# Patient Record
Sex: Male | Born: 1960 | Race: White | Hispanic: No | Marital: Married | Smoking: Former smoker
Health system: Southern US, Community
[De-identification: ages and names within clinical notes are randomized; demographics above are authoritative.]

## PROBLEM LIST (undated history)

## (undated) DIAGNOSIS — I219 Acute myocardial infarction, unspecified: Secondary | ICD-10-CM

## (undated) DIAGNOSIS — D499 Neoplasm of unspecified behavior of unspecified site: Secondary | ICD-10-CM

## (undated) DIAGNOSIS — R12 Heartburn: Secondary | ICD-10-CM

## (undated) DIAGNOSIS — Q676 Pectus excavatum: Secondary | ICD-10-CM

## (undated) DIAGNOSIS — N529 Male erectile dysfunction, unspecified: Secondary | ICD-10-CM

## (undated) DIAGNOSIS — I10 Essential (primary) hypertension: Secondary | ICD-10-CM

## (undated) DIAGNOSIS — Z86018 Personal history of other benign neoplasm: Secondary | ICD-10-CM

## (undated) HISTORY — PX: INGUINAL HERNIA REPAIR: SUR1180

## (undated) HISTORY — DX: Pectus excavatum: Q67.6

## (undated) HISTORY — DX: Male erectile dysfunction, unspecified: N52.9

## (undated) HISTORY — PX: CORONARY ANGIOPLASTY: SHX604

## (undated) HISTORY — PX: HERNIA REPAIR: SHX51

## (undated) HISTORY — DX: Neoplasm of unspecified behavior of unspecified site: D49.9

## (undated) HISTORY — DX: Heartburn: R12

## (undated) HISTORY — DX: Personal history of other benign neoplasm: Z86.018

---

## 2003-06-14 DIAGNOSIS — Z86018 Personal history of other benign neoplasm: Secondary | ICD-10-CM

## 2003-06-14 HISTORY — DX: Personal history of other benign neoplasm: Z86.018

## 2011-04-22 ENCOUNTER — Encounter: Payer: Self-pay | Admitting: Cardiovascular Disease

## 2011-05-26 ENCOUNTER — Encounter: Payer: Self-pay | Admitting: Cardiovascular Disease

## 2011-05-26 ENCOUNTER — Ambulatory Visit (INDEPENDENT_AMBULATORY_CARE_PROVIDER_SITE_OTHER): Payer: 59 | Admitting: Cardiovascular Disease

## 2011-05-26 ENCOUNTER — Encounter: Payer: Self-pay | Admitting: *Deleted

## 2011-05-26 DIAGNOSIS — R079 Chest pain, unspecified: Secondary | ICD-10-CM | POA: Insufficient documentation

## 2011-05-26 DIAGNOSIS — Q676 Pectus excavatum: Secondary | ICD-10-CM | POA: Insufficient documentation

## 2011-05-26 DIAGNOSIS — R9431 Abnormal electrocardiogram [ECG] [EKG]: Secondary | ICD-10-CM | POA: Insufficient documentation

## 2011-05-26 DIAGNOSIS — Z87891 Personal history of nicotine dependence: Secondary | ICD-10-CM | POA: Insufficient documentation

## 2011-05-26 DIAGNOSIS — R0602 Shortness of breath: Secondary | ICD-10-CM | POA: Insufficient documentation

## 2011-05-26 MED ORDER — ATORVASTATIN CALCIUM 10 MG PO TABS: 10.0000 mg | ORAL_TABLET | Freq: Every day | ORAL | Status: DC

## 2011-05-26 NOTE — Assessment & Plan Note (Signed)
He is relatively asymptomatic from his pectus excavatum. No significant shortness of breath with exertion. We'll order a treadmill study given his risk factor for coronary artery disease. EKG changes likely secondary to the positioning of his heart given the pectus. As he is relatively asymptomatic, no radical surgery needed.

## 2011-05-26 NOTE — Assessment & Plan Note (Signed)
Poor R-wave progression through the anterior precordial leads. Suspect secondary to his pectus.

## 2011-05-26 NOTE — Progress Notes (Signed)
   Patient ID: Daniel Benson, male    DOB: May 14, 1960, 51 y.o.   MRN: [id number]  HPI Comments: Daniel Benson is a very pleasant 51 year old gentleman with pectus excavatum who works at [employer] with 30 year smoking history, stopped 6 years ago with chronic shortness of breath, improved since he stopped smoking, rare episodes of left-sided chest pain and jaw pain typically at rest who presents for evaluation.  He reports that he is typically very active on weekends. He does hunting, sometimes climbs hills, chops wood. He does have shortness of breath but this is stable and probably better than 6 years ago. He has rare episodes of fleeting left-sided flank/chest pain lasting several seconds at a time, typically at rest. He describes it as "little stuff ". No new changes over the past several months in terms of his exercise ability. Jaw pain happens at times and feels very tight on the left.   EKG shows normal sinus rhythm with rate 75 beats per minute with poor R-wave progression through the anterior precordial leads, likely secondary to pectus excavatum.   Outpatient Encounter Prescriptions as of 05/26/2011  Medication Sig Dispense Refill  . Aspirin-Salicylamide-Caffeine (BC HEADACHE POWDER PO) Take by mouth.        Review of Systems  Constitutional: Negative.   HENT: Negative.   Eyes: Negative.   Respiratory: Negative.   Cardiovascular: Negative.   Gastrointestinal: Negative.   Musculoskeletal: Negative.   Skin: Negative.   Neurological: Negative.   Hematological: Negative.   Psychiatric/Behavioral: Negative.   All other systems reviewed and are negative.    BP 145/88  Pulse 75  Ht 5\' 11"  (1.803 m)  Wt 216 lb (97.977 kg)  BMI 30.13 kg/m2  Physical Exam  Nursing note and vitals reviewed. Constitutional: He is oriented to person, place, and time. He appears well-developed and well-nourished.  HENT:  Head: Normocephalic.  Nose: Nose normal.  Mouth/Throat: Oropharynx is clear  and moist.  Eyes: Conjunctivae are normal. Pupils are equal, round, and reactive to light.  Neck: Normal range of motion. Neck supple. No JVD present.  Cardiovascular: Normal rate, regular rhythm, S1 normal, S2 normal, normal heart sounds and intact distal pulses.  Exam reveals no gallop and no friction rub.   No murmur heard. Pulmonary/Chest: Effort normal and breath sounds normal. No respiratory distress. He has no wheezes. He has no rales. He exhibits no tenderness.       Sternum has significant depression inward it is nontender  Abdominal: Soft. Bowel sounds are normal. He exhibits no distension. There is no tenderness.  Musculoskeletal: Normal range of motion. He exhibits no edema and no tenderness.  Lymphadenopathy:    He has no cervical adenopathy.  Neurological: He is alert and oriented to person, place, and time. Coordination normal.  Skin: Skin is warm and dry. No rash noted. No erythema.  Psychiatric: He has a normal mood and affect. His behavior is normal. Judgment and thought content normal.           Assessment and Plan

## 2011-05-26 NOTE — Assessment & Plan Note (Signed)
Long history of smoking. I suspect he may have mild COPD

## 2011-05-26 NOTE — Patient Instructions (Signed)
You are doing well. Please start aspirin 81 mg coated once a day Start lipitor 10 mg once a day  We will set you up for a treadmill test for shortness of breath  Please call us if you have new issues that need to be addressed before your next appt.  Your physician wants you to follow-up in: 12 months.  You will receive a reminder letter in the mail two months in advance. If you don't receive a letter, please call our office to schedule the follow-up appointment.

## 2011-05-26 NOTE — Assessment & Plan Note (Signed)
Atypical type chest pain. We have suggested he have a routine treadmill study as he is concerned about his symptoms.

## 2011-05-26 NOTE — Assessment & Plan Note (Signed)
Treadmill study ordered for shortness of breath. Suspect secondary to long smoking history.

## 2011-06-11 ENCOUNTER — Ambulatory Visit (INDEPENDENT_AMBULATORY_CARE_PROVIDER_SITE_OTHER): Payer: 59 | Admitting: Cardiovascular Disease

## 2011-06-11 DIAGNOSIS — R079 Chest pain, unspecified: Secondary | ICD-10-CM

## 2011-06-11 DIAGNOSIS — R0602 Shortness of breath: Secondary | ICD-10-CM

## 2011-06-11 NOTE — Progress Notes (Signed)
Exercise Treadmill Test   Treadmill ordered for recent epsiodes of chest pain.  Resting EKG shows NSR with rate of 71 bpm, poor R wave progression Resting blood pressure of 122/80. Stand bruce protocal was used.  Patient exercised for 9 min 00 sec,  Peak heart rate of 160 bpm.  This was 100% of the maximum predicted heart rate (target heart rate 142). No symptoms of chest pain or lightheadedness were reported at peak stress or in recovery.  Peak Blood pressure recorded was 160/90. Heart rate at 3 minutes in recovery was 96 bpm. No ST changes concerning for ischemia.  FINAL IMPRESSION: Normal exercise stress test. No significant EKG changes concerning for ischemia. Excellent exercise tolerance.

## 2011-06-11 NOTE — Patient Instructions (Signed)
Normal stress test. Continue regular exercise and call the office for worsening symptoms, please follow up on an annual basis

## 2011-07-18 ENCOUNTER — Ambulatory Visit: Payer: Self-pay | Admitting: Gastroenterology

## 2011-07-18 LAB — HM COLONOSCOPY

## 2012-06-25 ENCOUNTER — Ambulatory Visit: Payer: 59 | Admitting: Cardiovascular Disease

## 2012-07-12 ENCOUNTER — Encounter: Payer: Self-pay | Admitting: Cardiovascular Disease

## 2012-07-12 ENCOUNTER — Ambulatory Visit (INDEPENDENT_AMBULATORY_CARE_PROVIDER_SITE_OTHER): Payer: 59 | Admitting: Cardiovascular Disease

## 2012-07-12 VITALS — BP 124/84 | HR 53 | Ht 71.0 in | Wt 209.0 lb

## 2012-07-12 DIAGNOSIS — R9431 Abnormal electrocardiogram [ECG] [EKG]: Secondary | ICD-10-CM

## 2012-07-12 DIAGNOSIS — E785 Hyperlipidemia, unspecified: Secondary | ICD-10-CM | POA: Insufficient documentation

## 2012-07-12 DIAGNOSIS — R0602 Shortness of breath: Secondary | ICD-10-CM

## 2012-07-12 NOTE — Patient Instructions (Addendum)
You are doing well. No medication changes were made.  We will check lipids today  Please call us if you have new issues that need to be addressed before your next appt.  Your physician wants you to follow-up in: 12 month.  You will receive a reminder letter in the mail two months in advance. If you don't receive a letter, please call our office to schedule the follow-up appointment.

## 2012-07-12 NOTE — Assessment & Plan Note (Signed)
We have suggested he continue on his statin. Continued exercise and small weight loss

## 2012-07-12 NOTE — Assessment & Plan Note (Signed)
Shortness of breath improved after he stopped smoking 7 years ago. No further episodes of chest pain. No further workup at this time

## 2012-07-12 NOTE — Progress Notes (Signed)
   Patient ID: Daniel Benson, male    DOB: 02-05-1961, 52 y.o.   MRN: [id number]  HPI Comments: Mr. Gerdeman is a very pleasant 52 year old gentleman with pectus excavatum who works at [employer] with 30 year smoking history, stopped in 2008 , previously with shortness of breath, improved since he stopped smoking, also with previous rare episodes of left-sided chest pain and jaw pain typically at rest who presents for routine followup  He reports that he is typically very active on weekends. He does hunting, sometimes climbs hills, chops wood. Shortness of breath has significantly improved. No recent chest pain symptoms. No recent jaw pain symptoms.   Total cholesterol 193 on low-dose statin   EKG shows normal sinus rhythm with rate 53 beats per minute with poor R-wave progression through the anterior precordial leads, likely secondary to pectus excavatum.   Outpatient Encounter Prescriptions as of 07/12/2012  Medication Sig Dispense Refill  . aspirin 81 MG tablet Take 81 mg by mouth daily.      . Aspirin-Salicylamide-Caffeine (BC HEADACHE POWDER PO) Take by mouth.      Marland Kitchen atorvastatin (LIPITOR) 10 MG tablet Take 1 tablet (10 mg total) by mouth daily.  90 tablet  4  . BIOTIN PO Take 1 tablet by mouth daily.       No facility-administered encounter medications on file as of 07/12/2012.     Review of Systems  Constitutional: Negative.   HENT: Negative.   Eyes: Negative.   Respiratory: Negative.   Cardiovascular: Negative.   Gastrointestinal: Negative.   Musculoskeletal: Negative.   Skin: Negative.   Neurological: Negative.   Psychiatric/Behavioral: Negative.   All other systems reviewed and are negative.    BP 124/84  Pulse 53  Ht 5\' 11"  (1.803 m)  Wt 209 lb (94.802 kg)  BMI 29.16 kg/m2  Physical Exam  Nursing note and vitals reviewed. Constitutional: He is oriented to person, place, and time. He appears well-developed and well-nourished.  HENT:  Head: Normocephalic.  Nose:  Nose normal.  Mouth/Throat: Oropharynx is clear and moist.  Eyes: Conjunctivae are normal. Pupils are equal, round, and reactive to light.  Neck: Normal range of motion. Neck supple. No JVD present.  Cardiovascular: Normal rate, regular rhythm, S1 normal, S2 normal, normal heart sounds and intact distal pulses.  Exam reveals no gallop and no friction rub.   No murmur heard. Pulmonary/Chest: Effort normal and breath sounds normal. No respiratory distress. He has no wheezes. He has no rales. He exhibits no tenderness.  Sternum has significant depression inward it is nontender  Abdominal: Soft. Bowel sounds are normal. He exhibits no distension. There is no tenderness.  Musculoskeletal: Normal range of motion. He exhibits no edema and no tenderness.  Lymphadenopathy:    He has no cervical adenopathy.  Neurological: He is alert and oriented to person, place, and time. Coordination normal.  Skin: Skin is warm and dry. No rash noted. No erythema.  Psychiatric: He has a normal mood and affect. His behavior is normal. Judgment and thought content normal.      Assessment and Plan

## 2012-07-13 LAB — LIPID PANEL
Chol/HDL Ratio: 2.3 ratio units (ref 0.0–5.0)
Cholesterol, Total: 142 mg/dL (ref 100–199)
HDL: 61 mg/dL (ref >39)
Triglycerides: 64 mg/dL (ref 0–149)

## 2012-08-24 DIAGNOSIS — Z86018 Personal history of other benign neoplasm: Secondary | ICD-10-CM

## 2012-08-24 HISTORY — DX: Personal history of other benign neoplasm: Z86.018

## 2012-08-30 ENCOUNTER — Other Ambulatory Visit: Payer: Self-pay | Admitting: Cardiovascular Disease

## 2012-08-31 ENCOUNTER — Other Ambulatory Visit: Payer: Self-pay | Admitting: *Deleted

## 2012-08-31 MED ORDER — ATORVASTATIN CALCIUM 10 MG PO TABS: 10.0000 mg | ORAL_TABLET | Freq: Every day | ORAL | Status: DC

## 2012-08-31 NOTE — Telephone Encounter (Signed)
Refilled [facility]. 

## 2013-03-09 ENCOUNTER — Ambulatory Visit: Payer: Self-pay | Admitting: Family Medicine

## 2013-07-01 ENCOUNTER — Ambulatory Visit: Payer: Self-pay | Admitting: Family Medicine

## 2013-08-17 ENCOUNTER — Telehealth: Payer: Self-pay | Admitting: *Deleted

## 2013-08-17 NOTE — Telephone Encounter (Signed)
Called patient for a 12 mth follow up appt with Dr. Rockey Situ. Patient stated that he didn't need to see Dr. Rockey Situ and didn't want to schedule one.

## 2014-12-14 ENCOUNTER — Other Ambulatory Visit: Payer: Self-pay | Admitting: Family Medicine

## 2015-03-10 ENCOUNTER — Other Ambulatory Visit: Payer: Self-pay | Admitting: Family Medicine

## 2015-06-09 ENCOUNTER — Other Ambulatory Visit: Payer: Self-pay | Admitting: Family Medicine

## 2015-09-15 ENCOUNTER — Other Ambulatory Visit: Payer: Self-pay | Admitting: Family Medicine

## 2015-10-17 ENCOUNTER — Ambulatory Visit (INDEPENDENT_AMBULATORY_CARE_PROVIDER_SITE_OTHER): Payer: 59

## 2015-10-17 ENCOUNTER — Encounter: Payer: Self-pay | Admitting: Podiatry

## 2015-10-17 ENCOUNTER — Ambulatory Visit (INDEPENDENT_AMBULATORY_CARE_PROVIDER_SITE_OTHER): Payer: 59 | Admitting: Podiatry

## 2015-10-17 VITALS — BP 135/82 | HR 66 | Resp 12

## 2015-10-17 DIAGNOSIS — B9689 Other specified bacterial agents as the cause of diseases classified elsewhere: Secondary | ICD-10-CM | POA: Insufficient documentation

## 2015-10-17 DIAGNOSIS — M722 Plantar fascial fibromatosis: Secondary | ICD-10-CM | POA: Diagnosis not present

## 2015-10-17 DIAGNOSIS — M79673 Pain in unspecified foot: Secondary | ICD-10-CM

## 2015-10-17 DIAGNOSIS — N529 Male erectile dysfunction, unspecified: Secondary | ICD-10-CM | POA: Insufficient documentation

## 2015-10-17 DIAGNOSIS — J019 Acute sinusitis, unspecified: Secondary | ICD-10-CM

## 2015-10-17 MED ORDER — METHYLPREDNISOLONE 4 MG PO TBPK: ORAL_TABLET | ORAL | 0 refills | Status: DC

## 2015-10-17 MED ORDER — MELOXICAM 15 MG PO TABS: 15.0000 mg | ORAL_TABLET | Freq: Every day | ORAL | 3 refills | Status: DC

## 2015-10-17 NOTE — Progress Notes (Signed)
   Subjective:    Patient ID: Daniel Benson, male    DOB: 1960-04-08, 55 y.o.   MRN: [id number]  HPI: He presents today with chief complaint of painful heels bilateral right greater than left times past 5 or 6 months. Mornings are particularly bad in that region sitting and gets back up walking is very painful. He works for [employer].    Review of Systems  Musculoskeletal: Positive for gait problem.       Objective:   Physical Exam: Vital signs are stable he is alert and oriented 3 pulses are palpable. Neurologic sensorium is intact. Muscle strength is normal. Orthopedic evaluation of systems a full range of motion or crepitation. He has pain on palpation medial calcaneal tubercle of the bilateral foot) the left. Radiographs taken today demonstrate osseously mature individual with soft tissue increased density of the plantar fascia calcaneal insertion site bilateral.          Assessment & Plan:  Assessment: Plantar fasciitis right greater than left.  Plan: Started him on a Medrol Dosepak to be followed by meloxicam. Injected the bilateral heels today placing the plantar fascia braces and a night splint. Discussed appropriate shoe gear stretching size ice therapies U modifications follow up with him in 1 month.

## 2015-10-17 NOTE — Progress Notes (Signed)
   Subjective:    Patient ID: Daniel Benson, male    DOB: 03-Feb-1961, 55 y.o.   MRN: [id number]  HPI    Review of Systems  All other systems reviewed and are negative.      Objective:   Physical Exam        Assessment & Plan:

## 2015-10-24 ENCOUNTER — Ambulatory Visit: Payer: Self-pay | Admitting: Podiatry

## 2015-11-02 ENCOUNTER — Ambulatory Visit (INDEPENDENT_AMBULATORY_CARE_PROVIDER_SITE_OTHER): Payer: 59 | Admitting: Family Medicine

## 2015-11-02 ENCOUNTER — Encounter: Payer: Self-pay | Admitting: Family Medicine

## 2015-11-02 VITALS — BP 124/64 | HR 66 | Temp 98.1°F | Resp 16 | Wt 208.8 lb

## 2015-11-02 DIAGNOSIS — H0013 Chalazion right eye, unspecified eyelid: Secondary | ICD-10-CM

## 2015-11-02 MED ORDER — ERYTHROMYCIN 5 MG/GM OP OINT: 1.0000 "application " | TOPICAL_OINTMENT | Freq: Four times a day (QID) | OPHTHALMIC | 0 refills | Status: DC

## 2015-11-02 NOTE — Patient Instructions (Signed)
Call if not improving for referral if needed to ophthalmology.

## 2015-11-02 NOTE — Progress Notes (Signed)
Subjective:     Patient ID: Daniel Benson, male   DOB: September 24, 1960, 55 y.o.   MRN: [id number]  HPI  Chief Complaint  Patient presents with  . Conjunctivitis    Patient comes in office today with concerns of itchy watery eyes over the past two weeks. Patient denies any cold like symptoms or URI symptoms. Patient reports that "it feels like something is in my eye."  . Jaw Pain    Patient reports "popping" of jaw for more than 30 days.   States right upper eyelid intermittently bothers him. No change in vision, matting, or contact use. Jaw popping is painless. Patiens reports jaw clenching at times but unsure whether he grinds his teeth. Currently on prednisone for an orthopedic problem.   Review of Systems     Objective:   Physical Exam  Constitutional: He appears well-developed and well-nourished. No distress.  Eyes: Pupils are equal, round, and reactive to light.  Right upper eyelid with 2-3 mm. Area of swelling, no drainage.  Musculoskeletal:  TMJ's without tenderness or crepitus with movement       Assessment:    1. Chalazion of right eye - erythromycin ophthalmic ointment; Place 1 application into the right eye 4 (four) times daily. Place in lower eyelid sac  Dispense: 3.5 g; Refill: 0    Plan:    Will call for ophthalmology referral if not improving. Will get dental opinion about his jaw.

## 2015-11-28 ENCOUNTER — Encounter: Payer: Self-pay | Admitting: Podiatry

## 2015-11-28 ENCOUNTER — Ambulatory Visit (INDEPENDENT_AMBULATORY_CARE_PROVIDER_SITE_OTHER): Payer: 59 | Admitting: Podiatry

## 2015-11-28 DIAGNOSIS — M779 Enthesopathy, unspecified: Secondary | ICD-10-CM

## 2015-11-28 DIAGNOSIS — M778 Other enthesopathies, not elsewhere classified: Secondary | ICD-10-CM

## 2015-11-28 DIAGNOSIS — M722 Plantar fascial fibromatosis: Secondary | ICD-10-CM | POA: Diagnosis not present

## 2015-11-28 DIAGNOSIS — M7751 Other enthesopathy of right foot: Secondary | ICD-10-CM

## 2015-11-29 NOTE — Progress Notes (Signed)
He presents today for follow-up of his plantar fasciitis bilaterally. He states that the plantarflexed fasciitis is considerably better however he still has tenderness around the forefoot as he points to the second third metatarsophalangeal joint area of the right foot. He denies changes in his past medical history medications and allergies states that he's been taking his meloxicam without any complications. Continues to utilize his plantar fascia brace and night splint.  Objective: Vital signs are stable alert and oriented 3. Pulses are palpable. Neurologic sensorium is intact much decrease of pain on palpation medial calcaneal tubercles bilateral. He still has tenderness on palpation of the second and third metatarsophalangeal joints of the right foot. No deviation of the toes.  Assessment: Capsulitis of the forefoot right resolving plantar fasciitis bilaterally.  Plan: I reinjected the forefoot today with Kenalog and local anesthetic. No injections to the heels. I recommended that he continue his anti-inflammatories plantar fascia brace and night splint. I'll follow-up with him in 1 month at which time we will consider orthotics if necessary.

## 2015-12-14 ENCOUNTER — Encounter: Payer: Self-pay | Admitting: Family Medicine

## 2015-12-14 ENCOUNTER — Ambulatory Visit (INDEPENDENT_AMBULATORY_CARE_PROVIDER_SITE_OTHER): Payer: 59 | Admitting: Family Medicine

## 2015-12-14 VITALS — BP 124/72 | HR 66 | Temp 98.2°F | Resp 14 | Ht 70.75 in | Wt 204.0 lb

## 2015-12-14 DIAGNOSIS — Z23 Encounter for immunization: Secondary | ICD-10-CM | POA: Diagnosis not present

## 2015-12-14 DIAGNOSIS — Z0000 Encounter for general adult medical examination without abnormal findings: Secondary | ICD-10-CM | POA: Diagnosis not present

## 2015-12-14 DIAGNOSIS — E782 Mixed hyperlipidemia: Secondary | ICD-10-CM | POA: Diagnosis not present

## 2015-12-14 NOTE — Progress Notes (Signed)
Subjective:     Patient ID: Daniel Benson, male   DOB: Oct 03, 1960, 55 y.o.   MRN: [id number]  HPI  Chief Complaint  Patient presents with  . Annual Exam  States he feel well. Will be due to repeat colonoscopy in 2018.   Review of Systems General: Feeling well; will update Tdap and influenza vaccines HEENT: regular dental visits; overdue for eye exam and encouraged to do so Cardiovascular: no chest pain, shortness of breath, or palpitations GI: no heartburn, no change in bowel habits or blood in the stool GU: nocturia x 1, no change in bladder habits  Psychiatric: not depressed Musculoskeletal: reports right knee tend to have painless grinding when going up and down stairs. Currently seeing podiatry for plantar fasciitis. Skin: See dermatology, Dr.Kowalski, annually for annual skin survey.    Objective:   Physical Exam  Constitutional: He appears well-developed and well-nourished. No distress.  Eyes: PERRLA, EOMI Neck: no thyromegaly, tenderness or nodules, no carotid bruits or cervical adenopathy ENT: TM's intact without inflammation; No tonsillar enlargement or exudate, Lungs: Clear, pectus excavatum Heart : RRR without murmur or gallop Abd: bowel sounds present, soft, non-tender, no organomegaly Rectal: Prostate firm and non-tender Extremities: no edema, Right knee with FROM without crepitus. Skin: no atypical lesions noted; seborrheic keratoses on his back    Assessment:    1. Annual physical exam - PSA - Comprehensive metabolic panel  2. Need for influenza vaccination - Flu Vaccine QUAD 36+ mos PF IM (Fluarix & Fluzone Quad PF)  3. Need for diphtheria-tetanus-pertussis (Tdap) vaccine - Tdap vaccine greater than or equal to 7yo IM  4. Mixed hyperlipidemia - Lipid panel   Plan:    Further f/u pending lab work.

## 2015-12-14 NOTE — Patient Instructions (Signed)
We will cal you with the lab results. 

## 2015-12-15 LAB — COMPREHENSIVE METABOLIC PANEL
A/G RATIO: 1.8 (ref 1.2–2.2)
ALBUMIN: 4.2 g/dL (ref 3.5–5.5)
ALT: 24 IU/L (ref 0–44)
AST: 20 IU/L (ref 0–40)
Alkaline Phosphatase: 67 IU/L (ref 39–117)
BUN / CREAT RATIO: 15 (ref 9–20)
BUN: 16 mg/dL (ref 6–24)
Bilirubin Total: 0.8 mg/dL (ref 0.0–1.2)
CALCIUM: 9.5 mg/dL (ref 8.7–10.2)
CO2: 27 mmol/L (ref 18–29)
Chloride: 101 mmol/L (ref 96–106)
Creatinine, Ser: 1.07 mg/dL (ref 0.76–1.27)
GFR, EST AFRICAN AMERICAN: 89 mL/min/{1.73_m2} (ref >59)
GFR, EST NON AFRICAN AMERICAN: 77 mL/min/{1.73_m2} (ref >59)
GLOBULIN, TOTAL: 2.4 g/dL (ref 1.5–4.5)
Glucose: 95 mg/dL (ref 65–99)
POTASSIUM: 4.7 mmol/L (ref 3.5–5.2)
Sodium: 143 mmol/L (ref 134–144)
TOTAL PROTEIN: 6.6 g/dL (ref 6.0–8.5)

## 2015-12-15 LAB — LIPID PANEL
CHOL/HDL RATIO: 2.5 ratio (ref 0.0–5.0)
CHOLESTEROL TOTAL: 132 mg/dL (ref 100–199)
HDL: 53 mg/dL (ref >39)
LDL Calculated: 65 mg/dL (ref 0–99)
TRIGLYCERIDES: 68 mg/dL (ref 0–149)
VLDL Cholesterol Cal: 14 mg/dL (ref 5–40)

## 2015-12-15 LAB — PSA: PROSTATE SPECIFIC AG, SERUM: 1.7 ng/mL (ref 0.0–4.0)

## 2015-12-18 ENCOUNTER — Telehealth: Payer: Self-pay

## 2015-12-18 NOTE — Telephone Encounter (Signed)
-----   Message from Carmon Ginsberg, [place] sent at 12/15/2015  9:02 AM EDT ----- Labs look good-continue current medication

## 2015-12-18 NOTE — Telephone Encounter (Signed)
[id number] 

## 2015-12-19 NOTE — Telephone Encounter (Signed)
Pt is returning call.  CB#[phone]/MW

## 2015-12-19 NOTE — Telephone Encounter (Signed)
Patient is requesting a refill for Atorvastatin sent to [facility] [address]

## 2015-12-19 NOTE — Telephone Encounter (Signed)
Patient has been advised. KW 

## 2015-12-20 ENCOUNTER — Other Ambulatory Visit: Payer: Self-pay | Admitting: Family Medicine

## 2015-12-20 DIAGNOSIS — E782 Mixed hyperlipidemia: Secondary | ICD-10-CM

## 2015-12-20 MED ORDER — ATORVASTATIN CALCIUM 10 MG PO TABS: 10.0000 mg | ORAL_TABLET | Freq: Every day | ORAL | 3 refills | Status: DC

## 2015-12-20 NOTE — Telephone Encounter (Signed)
Refill completed.

## 2015-12-21 ENCOUNTER — Telehealth: Payer: Self-pay | Admitting: *Deleted

## 2015-12-21 MED ORDER — MELOXICAM 15 MG PO TABS: 15.0000 mg | ORAL_TABLET | Freq: Every day | ORAL | 0 refills | Status: DC

## 2015-12-21 NOTE — Telephone Encounter (Signed)
Received request for #90 Meloxicam 15mg . Dr. Marta Antu for #90 +1refill.

## 2015-12-31 ENCOUNTER — Encounter: Payer: Self-pay | Admitting: Podiatry

## 2015-12-31 ENCOUNTER — Ambulatory Visit (INDEPENDENT_AMBULATORY_CARE_PROVIDER_SITE_OTHER): Payer: 59 | Admitting: Podiatry

## 2015-12-31 DIAGNOSIS — M722 Plantar fascial fibromatosis: Secondary | ICD-10-CM | POA: Diagnosis not present

## 2015-12-31 DIAGNOSIS — M779 Enthesopathy, unspecified: Secondary | ICD-10-CM

## 2015-12-31 DIAGNOSIS — M7751 Other enthesopathy of right foot: Secondary | ICD-10-CM

## 2015-12-31 DIAGNOSIS — M778 Other enthesopathies, not elsewhere classified: Secondary | ICD-10-CM

## 2015-12-31 NOTE — Progress Notes (Signed)
He presents today for follow-up of plantar fasciitis and forefoot pain right. He states that the bilateral plantar fasciitis is resolving quite nicely at approximately 98% improved. He is still complaining of some soreness in the forefoot which she states seems to be getting better after the injection.  Objective: Vital signs are stable he is alert and oriented 3. No reproducible pain on palpation today.  Assessment: Resolving plantar fasciitis and started forefoot capsulitis.  Plan: Follow up with me on an as-needed basis and continue conservative therapies including anti-inflammatories and braces.

## 2016-01-31 ENCOUNTER — Ambulatory Visit (INDEPENDENT_AMBULATORY_CARE_PROVIDER_SITE_OTHER): Payer: 59 | Admitting: Family Medicine

## 2016-01-31 ENCOUNTER — Encounter: Payer: Self-pay | Admitting: Family Medicine

## 2016-01-31 VITALS — BP 110/70 | HR 58 | Temp 98.3°F | Resp 16 | Wt 211.6 lb

## 2016-01-31 DIAGNOSIS — B001 Herpesviral vesicular dermatitis: Secondary | ICD-10-CM | POA: Diagnosis not present

## 2016-01-31 MED ORDER — VALACYCLOVIR HCL 1 G PO TABS: ORAL_TABLET | ORAL | 1 refills | Status: AC

## 2016-01-31 NOTE — Patient Instructions (Signed)
Cold Sore Introduction A cold sore, also called a fever blister, is a skin infection that is caused by a virus. This infection causes small, fluid-filled sores to form inside of the mouth or on the lips, gums, nose, chin, or cheeks. Cold sores can spread to other parts of the body, such as the eyes or fingers. Cold sores can be spread or passed from person to person (contagious) until the sores crust over completely. Cold sores can be spread through close contact, such as kissing or sharing a drinking glass. Follow these instructions at home: Medicines  Take or apply over-the-counter and prescription medicines only as told by your doctor.  Use a cotton-tip swab to apply creams or gels to your sores. Sore Care  Do not touch the sores or pick the scabs.  Wash your hands often. Do not touch your eyes without washing your hands first.  Keep the sores clean and dry.  If directed, apply ice to the sores:  Put ice in a plastic bag.  Place a towel between your skin and the bag.  Leave the ice on for 20 minutes, 2-3 times per day. Lifestyle  Do not kiss, have oral sex, or share personal items until your sores heal.  Eat a soft, bland diet. Avoid eating hot, cold, or salty foods. These can hurt your mouth.  Use a straw if it hurts to drink out of a glass.  Avoid the sun and limit your stress if these things trigger outbreaks. If sun causes cold sores, apply sunscreen on your lips before being out in the sun. Contact a doctor if:  You have symptoms for more than two weeks.  You have pus coming from the sores.  You have redness that is spreading.  You have pain or irritation in your eye.  You get sores on your genitals.  Your sores do not heal within two weeks.  You get cold sores often. Get help right away if:  You have a fever and your symptoms suddenly get worse.  You have a headache and confusion. This information is not intended to replace advice given to you by your  health care provider. Make sure you discuss any questions you have with your health care provider. Document Released: 08/12/2011 Document Revised: 07/19/2015 Document Reviewed: 12/01/2014  2017 Elsevier

## 2016-01-31 NOTE — Progress Notes (Signed)
Subjective:     Patient ID: Daniel Benson, male   DOB: Jul 01, 1960, 55 y.o.   MRN: [id number]  HPI  Chief Complaint  Patient presents with  . Blister    Patient comes in office today with concerns of a fever blister that has appeared on patients bottom lip 6 days ago. Patient reports that he has used otc Abreva with no relief.    Reports he will have up to 6 episodes a year triggered by lip irritation or drying out. Previously has used Valtrex and wishes prescription.   Review of Systems     Objective:   Physical Exam  Skin:  Lower lip with resolving fever blister with dried blood from recent re-irritation.       Assessment:    1. Recurrent cold sore - valACYclovir (VALTREX) 1000 MG tablet; 2 pills every 12 hours for one day at onset of fever blister/cold sore  Dispense: 12 tablet; Refill: 1    Plan:    f/u as needed

## 2016-02-08 ENCOUNTER — Other Ambulatory Visit: Payer: Self-pay | Admitting: Family Medicine

## 2016-02-08 ENCOUNTER — Telehealth: Payer: Self-pay | Admitting: Family Medicine

## 2016-02-08 DIAGNOSIS — H0011 Chalazion right upper eyelid: Secondary | ICD-10-CM

## 2016-02-08 MED ORDER — ERYTHROMYCIN 5 MG/GM OP OINT: 1.0000 "application " | TOPICAL_OINTMENT | Freq: Four times a day (QID) | OPHTHALMIC | 0 refills | Status: DC

## 2016-02-08 NOTE — Telephone Encounter (Signed)
Please review-aa 

## 2016-02-08 NOTE — Telephone Encounter (Signed)
Pt contacted office for refill request on the following medications: erythromycin ophthalmic ointment [facility] [address] stated he saw Mikki Santee on 11/02/15 and was given these eye drops for an infection. Pt stated that he has it again in the same eye and has misplaced the remaining eye drops and would like a [facility]. Please advise. Thanks TNP

## 2016-02-08 NOTE — Telephone Encounter (Signed)
Pt advised-aa 

## 2016-02-08 NOTE — Telephone Encounter (Signed)
Eye ointment sent in.

## 2016-02-26 ENCOUNTER — Encounter: Payer: Self-pay | Admitting: Family Medicine

## 2016-02-26 ENCOUNTER — Ambulatory Visit (INDEPENDENT_AMBULATORY_CARE_PROVIDER_SITE_OTHER): Payer: 59 | Admitting: Family Medicine

## 2016-02-26 VITALS — BP 126/74 | HR 78 | Temp 98.8°F | Resp 16 | Wt 201.0 lb

## 2016-02-26 DIAGNOSIS — H0012 Chalazion right lower eyelid: Secondary | ICD-10-CM | POA: Diagnosis not present

## 2016-02-26 NOTE — Patient Instructions (Signed)
Chalazion : Do follow up with the eye doctor. Continue warm compresses. Introduction A chalazion is a swelling or lump on the eyelid. It can affect the upper or lower eyelid. What are the causes? This condition may be caused by:  Long-lasting (chronic) inflammation of the eyelid glands.  A blocked oil gland in the eyelid. What are the signs or symptoms? Symptoms of this condition include:  A swelling on the eyelid. The swelling may spread to areas around the eye.  A hard lump on the eyelid. This lump may make it hard to see out of the eye. How is this diagnosed? This condition is diagnosed with an examination of the eye. How is this treated? This condition is treated by applying a warm compress to the eyelid. If the condition does not improve after two days, it may be treated with:  Surgery.  Medicine that is injected into the chalazion by a health care provider.  Medicine that is applied to the eye. Follow these instructions at home:  Do not touch the chalazion.  Do not try to remove the pus, such as by squeezing the chalazion or sticking it with a pin or needle.  Do not rub your eyes.  Wash your hands often. Dry your hands with a clean towel.  Keep your face, scalp, and eyebrows clean.  Avoid wearing eye makeup.  Apply a warm, moist compress to the eyelid 4-6 times a day for 10-15 minutes at a time. This will help to open any blocked glands and help to reduce redness and swelling.  Apply over-the-counter and prescription medicines only as told by your health care provider.  If the chalazion does not break open (rupture) on its own in a month, return to your health care provider.  Keep all follow-up appointments as told by your health care provider. This is important. Contact a health care provider if:  Your eyelid has not improved in 4 weeks.  Your eyelid is getting worse.  You have a fever.  The chalazion does not rupture on its own with home treatment in a  month. Get help right away if:  You have pain in your eye.  Your vision changes.  The chalazion becomes painful or red  The chalazion gets bigger. This information is not intended to replace advice given to you by your health care provider. Make sure you discuss any questions you have with your health care provider. Document Released: 02/08/2000 Document Revised: 07/19/2015 Document Reviewed: 06/05/2014  2017 [employer]

## 2016-02-26 NOTE — Progress Notes (Signed)
Subjective:     Patient ID: Daniel Benson, male   DOB: [birth year], 56 y.o.   MRN: [id number]  HPI  Chief Complaint  Patient presents with  . Eye Pain    Patient reports that he has a lump under his right eye X 1 week. Patient reports that it is tender when touched. Patient reports that he has been treated for this in the past, and he was prescribed abx ointment which helped.   States he has used warm compresses as well. No longer sore or affecting his vision. He is concerned that it has not resolved.   Review of Systems     Objective:   Physical Exam  Constitutional: He appears well-developed and well-nourished. No distress.  HENT:  Right lower eyelid with 4 mm papule with a tiny pustular appearing tip. No drainage or conjunctivitis.       Assessment:    1. Chalazion of right lower eyelid     Plan:    Continue warm compresses. 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

## 2016-12-01 DIAGNOSIS — B078 Other viral warts: Secondary | ICD-10-CM | POA: Diagnosis not present

## 2016-12-01 DIAGNOSIS — L570 Actinic keratosis: Secondary | ICD-10-CM | POA: Diagnosis not present

## 2016-12-01 DIAGNOSIS — Z1283 Encounter for screening for malignant neoplasm of skin: Secondary | ICD-10-CM | POA: Diagnosis not present

## 2016-12-01 DIAGNOSIS — L820 Inflamed seborrheic keratosis: Secondary | ICD-10-CM | POA: Diagnosis not present

## 2016-12-01 DIAGNOSIS — D229 Melanocytic nevi, unspecified: Secondary | ICD-10-CM | POA: Diagnosis not present

## 2016-12-01 DIAGNOSIS — D1801 Hemangioma of skin and subcutaneous tissue: Secondary | ICD-10-CM | POA: Diagnosis not present

## 2016-12-23 ENCOUNTER — Other Ambulatory Visit: Payer: Self-pay | Admitting: Family Medicine

## 2016-12-23 DIAGNOSIS — E782 Mixed hyperlipidemia: Secondary | ICD-10-CM

## 2017-01-01 DIAGNOSIS — B078 Other viral warts: Secondary | ICD-10-CM | POA: Diagnosis not present

## 2017-02-26 DIAGNOSIS — B078 Other viral warts: Secondary | ICD-10-CM | POA: Diagnosis not present

## 2017-02-26 DIAGNOSIS — R208 Other disturbances of skin sensation: Secondary | ICD-10-CM | POA: Diagnosis not present

## 2017-03-24 ENCOUNTER — Telehealth: Payer: Self-pay | Admitting: Family Medicine

## 2017-03-24 ENCOUNTER — Other Ambulatory Visit: Payer: Self-pay | Admitting: Family Medicine

## 2017-03-24 DIAGNOSIS — E782 Mixed hyperlipidemia: Secondary | ICD-10-CM

## 2017-03-24 MED ORDER — ATORVASTATIN CALCIUM 10 MG PO TABS: 10.0000 mg | ORAL_TABLET | Freq: Every day | ORAL | 0 refills | Status: DC

## 2017-03-24 NOTE — Telephone Encounter (Signed)
I will refill it pending his appointment. He works for [employer] so suspect they are busy this time of year.

## 2017-03-24 NOTE — Telephone Encounter (Signed)
[facility] faxed a refill request for a 90-days supply for the following medication. Thanks CC  atorvastatin (LIPITOR) 10 MG tablet

## 2017-03-24 NOTE — Telephone Encounter (Signed)
Error

## 2017-03-24 NOTE — Telephone Encounter (Signed)
I reviewed over chart last Lipid Panel was ordered on 12/14/15 and medication was last filled on 12/23/16, on last refill in note it stated that patient needed to come back in for office visit. Tried contacting patient to get him to arrange appt, left message for patient to call office back. KW

## 2017-03-26 NOTE — Telephone Encounter (Signed)
Unable to reach patient at this time, will try again at a later time. KW 

## 2017-03-30 ENCOUNTER — Ambulatory Visit (INDEPENDENT_AMBULATORY_CARE_PROVIDER_SITE_OTHER): Payer: 59 | Admitting: Family Medicine

## 2017-03-30 ENCOUNTER — Encounter: Payer: Self-pay | Admitting: Family Medicine

## 2017-03-30 VITALS — BP 126/68 | HR 52 | Temp 99.0°F | Resp 16 | Wt 211.0 lb

## 2017-03-30 DIAGNOSIS — R9431 Abnormal electrocardiogram [ECG] [EKG]: Secondary | ICD-10-CM

## 2017-03-30 DIAGNOSIS — J069 Acute upper respiratory infection, unspecified: Secondary | ICD-10-CM | POA: Diagnosis not present

## 2017-03-30 MED ORDER — HYDROCODONE-HOMATROPINE 5-1.5 MG/5ML PO SYRP: ORAL_SOLUTION | ORAL | 0 refills | Status: DC

## 2017-03-30 NOTE — Progress Notes (Signed)
Subjective:     Patient ID: [name], male   DOB: 1961/01/04, 57 y.o.   MRN: [id number] Chief Complaint  Patient presents with  . URI    Started about four days.    HPI Reports cold sx and states sinus drainage has gone from purulent to clear. Generally feels like he is improving  Review of Systems     Objective:   Physical Exam  Constitutional: He appears well-developed and well-nourished. No distress.  Ears: T.M's intact without inflammation Sinuses: non-tender Throat: no tonsillar enlargement or exudate Neck: no cervical adenopathy Lungs: clear     Assessment:    1. Viral upper respiratory tract infection - HYDROcodone-homatropine (HYCODAN) 5-1.5 MG/5ML syrup; 5 ml 4-6 hours as needed for cough  Dispense: 120 mL; Refill: 0    Plan:    Discussed otc medication. Phone f/u if sinuses not continuing to improve by the end of the week. Will schedule a physical this year.

## 2017-03-30 NOTE — Patient Instructions (Addendum)
Try Mucinex D for congestion, Delsym for cough. Considering scheduling a physical this year. Let me know if sinuses not improved by the end of the week.

## 2017-05-28 DIAGNOSIS — B078 Other viral warts: Secondary | ICD-10-CM | POA: Diagnosis not present

## 2017-06-17 ENCOUNTER — Other Ambulatory Visit: Payer: Self-pay | Admitting: Family Medicine

## 2017-06-17 DIAGNOSIS — E782 Mixed hyperlipidemia: Secondary | ICD-10-CM

## 2017-08-10 DIAGNOSIS — B078 Other viral warts: Secondary | ICD-10-CM | POA: Diagnosis not present

## 2017-09-15 ENCOUNTER — Ambulatory Visit (INDEPENDENT_AMBULATORY_CARE_PROVIDER_SITE_OTHER): Payer: 59 | Admitting: Family Medicine

## 2017-09-15 ENCOUNTER — Telehealth: Payer: Self-pay

## 2017-09-15 ENCOUNTER — Encounter: Payer: Self-pay | Admitting: Family Medicine

## 2017-09-15 ENCOUNTER — Other Ambulatory Visit: Payer: Self-pay | Admitting: Family Medicine

## 2017-09-15 ENCOUNTER — Ambulatory Visit
Admission: RE | Admit: 2017-09-15 | Discharge: 2017-09-15 | Disposition: A | Discharge status: Home / Self Care | Payer: 59 | Source: Ambulatory Visit | Attending: Family Medicine | Admitting: Family Medicine

## 2017-09-15 VITALS — BP 134/86 | HR 52 | Temp 97.8°F | Resp 15 | Ht 71.0 in | Wt 197.4 lb

## 2017-09-15 DIAGNOSIS — E782 Mixed hyperlipidemia: Secondary | ICD-10-CM

## 2017-09-15 DIAGNOSIS — Z87438 Personal history of other diseases of male genital organs: Secondary | ICD-10-CM

## 2017-09-15 DIAGNOSIS — M19012 Primary osteoarthritis, left shoulder: Secondary | ICD-10-CM | POA: Diagnosis not present

## 2017-09-15 DIAGNOSIS — M25512 Pain in left shoulder: Principal | ICD-10-CM

## 2017-09-15 DIAGNOSIS — G8929 Other chronic pain: Secondary | ICD-10-CM | POA: Diagnosis not present

## 2017-09-15 DIAGNOSIS — Z1211 Encounter for screening for malignant neoplasm of colon: Secondary | ICD-10-CM

## 2017-09-15 DIAGNOSIS — Z0000 Encounter for general adult medical examination without abnormal findings: Secondary | ICD-10-CM | POA: Diagnosis not present

## 2017-09-15 MED ORDER — SILDENAFIL CITRATE 50 MG PO TABS: 50.0000 mg | ORAL_TABLET | Freq: Every day | ORAL | 5 refills | Status: DC | PRN

## 2017-09-15 NOTE — Telephone Encounter (Signed)
[id number] 

## 2017-09-15 NOTE — Progress Notes (Signed)
  Subjective:     Patient ID: Daniel Benson, male   DOB: 11/11/1960, 57 y.o.   MRN: [id number] Chief Complaint  Patient presents with  . Annual Exam    Patient comes in office today for his annual physical he states that he feels well today and has no concerns to address. Patient states that he is working on improving his diet to be more balanced and cutting back on sugars, patient is staying active daily with work duties and house work. Patient reports that he sleeps on average 7hrs a night and states that his libido is normal. Patient last reported Tdap was 12/14/15 he is due today for colonoscopy screening and labs.    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 Sees Dr. Nehemiah Massed annually for skin surveys.  Review of Systems General: Feeling well. Low risk for Hep C and defers screen. HEENT: regular dental visits and is pending eye exam (reading glasses). Cardiovascular: no chest pain, shortness of breath, or palpitations GI: no heartburn, no change in bowel habits or blood in the stool GU: nocturia x 0, no change in bladder habits. Wishes to get back on Viagra. Psychiatric: not depressed Musculoskeletal: Reports chronic left shoulder pain and wishes orthopedic referral.    Objective:   Physical Exam  Constitutional: He appears well-developed and well-nourished. No distress.  Eyes: PERRLA, EOMI Neck: no thyromegaly, tenderness or nodules, no cervical adenopathy or carotid bruits. ENT: TM's intact without inflammation; No tonsillar enlargement or exudate, Lungs: Clear Heart : RRR without murmur or gallop Abd: bowel sounds present, soft, non-tender, no organomegaly Rectal: Prostate firm and non-tender Extremities: no edema; Left shoulder strength 5/5. Mild tenderness at acromial tip. Mild increased pain with flexion > 90 degrees      Assessment:    1. Screening for colon cancer - Ambulatory referral to Gastroenterology  2. Annual physical exam - PSA -  Comprehensive metabolic panel  3. Chronic left shoulder pain - DG Shoulder Left; Future - Ambulatory referral to Orthopedic Surgery  4. Mixed hyperlipidemia - Lipid panel  5. History of erectile dysfunction: rx for sildanefil    Plan:    Further f/u pending lab work.

## 2017-09-15 NOTE — Telephone Encounter (Signed)
-----   Message from Carmon Ginsberg, [place] sent at 09/15/2017 10:48 AM EDT ----- X-ray consistent with arthritic changes. Orthopedic referral in progress

## 2017-09-15 NOTE — Patient Instructions (Signed)
We will call you about the lab results and the referral to orthopedics and for colonoscopy.

## 2017-09-16 ENCOUNTER — Other Ambulatory Visit: Payer: Self-pay

## 2017-09-16 DIAGNOSIS — B078 Other viral warts: Secondary | ICD-10-CM | POA: Diagnosis not present

## 2017-09-16 DIAGNOSIS — Z86010 Personal history of colonic polyps: Secondary | ICD-10-CM

## 2017-09-16 LAB — LIPID PANEL
Chol/HDL Ratio: 2.4 ratio (ref 0.0–5.0)
Cholesterol, Total: 130 mg/dL (ref 100–199)
HDL: 54 mg/dL (ref >39)
LDL CALC: 62 mg/dL (ref 0–99)
TRIGLYCERIDES: 69 mg/dL (ref 0–149)
VLDL Cholesterol Cal: 14 mg/dL (ref 5–40)

## 2017-09-16 LAB — COMPREHENSIVE METABOLIC PANEL
ALT: 19 IU/L (ref 0–44)
AST: 18 IU/L (ref 0–40)
Albumin/Globulin Ratio: 1.9 (ref 1.2–2.2)
Albumin: 4.4 g/dL (ref 3.5–5.5)
Alkaline Phosphatase: 69 IU/L (ref 39–117)
BUN/Creatinine Ratio: 13 (ref 9–20)
BUN: 15 mg/dL (ref 6–24)
Bilirubin Total: 1.2 mg/dL (ref 0.0–1.2)
CALCIUM: 9.1 mg/dL (ref 8.7–10.2)
CO2: 24 mmol/L (ref 20–29)
CREATININE: 1.14 mg/dL (ref 0.76–1.27)
Chloride: 103 mmol/L (ref 96–106)
GFR calc Af Amer: 81 mL/min/{1.73_m2} (ref >59)
GFR calc non Af Amer: 70 mL/min/{1.73_m2} (ref >59)
GLOBULIN, TOTAL: 2.3 g/dL (ref 1.5–4.5)
Glucose: 97 mg/dL (ref 65–99)
Potassium: 4.3 mmol/L (ref 3.5–5.2)
SODIUM: 141 mmol/L (ref 134–144)
TOTAL PROTEIN: 6.7 g/dL (ref 6.0–8.5)

## 2017-09-16 LAB — PSA: Prostate Specific Ag, Serum: 2 ng/mL (ref 0.0–4.0)

## 2017-09-16 NOTE — Telephone Encounter (Signed)
Pt returned call for results. Please advise. Thanks TNP

## 2017-09-16 NOTE — Telephone Encounter (Signed)
Patient advised.KW 

## 2017-09-18 ENCOUNTER — Other Ambulatory Visit: Payer: Self-pay

## 2017-10-07 DIAGNOSIS — M13819 Other specified arthritis, unspecified shoulder: Secondary | ICD-10-CM | POA: Diagnosis not present

## 2017-10-14 ENCOUNTER — Encounter: Payer: Self-pay | Admitting: *Deleted

## 2017-10-15 ENCOUNTER — Encounter
Admission: RE | Disposition: A | Discharge status: Home / Self Care | Payer: Self-pay | Source: Ambulatory Visit | Attending: Gastroenterology

## 2017-10-15 ENCOUNTER — Other Ambulatory Visit: Payer: Self-pay

## 2017-10-15 ENCOUNTER — Ambulatory Visit
Admission: RE | Admit: 2017-10-15 | Discharge: 2017-10-15 | Disposition: A | Discharge status: Home / Self Care | Payer: 59 | Source: Ambulatory Visit | Attending: Gastroenterology | Admitting: Gastroenterology

## 2017-10-15 ENCOUNTER — Encounter: Payer: Self-pay | Admitting: *Deleted

## 2017-10-15 ENCOUNTER — Ambulatory Visit: Payer: 59 | Admitting: Anesthesiology

## 2017-10-15 DIAGNOSIS — N529 Male erectile dysfunction, unspecified: Secondary | ICD-10-CM | POA: Diagnosis not present

## 2017-10-15 DIAGNOSIS — Z8249 Family history of ischemic heart disease and other diseases of the circulatory system: Secondary | ICD-10-CM | POA: Diagnosis not present

## 2017-10-15 DIAGNOSIS — K635 Polyp of colon: Secondary | ICD-10-CM | POA: Diagnosis not present

## 2017-10-15 DIAGNOSIS — Z1211 Encounter for screening for malignant neoplasm of colon: Secondary | ICD-10-CM | POA: Insufficient documentation

## 2017-10-15 DIAGNOSIS — Z806 Family history of leukemia: Secondary | ICD-10-CM | POA: Diagnosis not present

## 2017-10-15 DIAGNOSIS — Z79899 Other long term (current) drug therapy: Secondary | ICD-10-CM | POA: Insufficient documentation

## 2017-10-15 DIAGNOSIS — Z87891 Personal history of nicotine dependence: Secondary | ICD-10-CM | POA: Insufficient documentation

## 2017-10-15 DIAGNOSIS — D126 Benign neoplasm of colon, unspecified: Secondary | ICD-10-CM | POA: Diagnosis not present

## 2017-10-15 DIAGNOSIS — Z7982 Long term (current) use of aspirin: Secondary | ICD-10-CM | POA: Insufficient documentation

## 2017-10-15 DIAGNOSIS — Z86010 Personal history of colonic polyps: Secondary | ICD-10-CM

## 2017-10-15 DIAGNOSIS — Z880 Allergy status to penicillin: Secondary | ICD-10-CM | POA: Insufficient documentation

## 2017-10-15 DIAGNOSIS — D122 Benign neoplasm of ascending colon: Secondary | ICD-10-CM | POA: Diagnosis not present

## 2017-10-15 HISTORY — PX: COLONOSCOPY WITH PROPOFOL: SHX5780

## 2017-10-15 SURGERY — COLONOSCOPY WITH PROPOFOL
Anesthesia: General

## 2017-10-15 MED ORDER — PROPOFOL 10 MG/ML IV BOLUS
INTRAVENOUS | Status: DC | PRN
  Administered 2017-10-15: 20 mg via INTRAVENOUS
  Administered 2017-10-15: 40 mg via INTRAVENOUS

## 2017-10-15 MED ORDER — PROPOFOL 10 MG/ML IV BOLUS
INTRAVENOUS | Status: AC
  Filled 2017-10-15: qty 20

## 2017-10-15 MED ORDER — PROPOFOL 500 MG/50ML IV EMUL
INTRAVENOUS | Status: DC | PRN
  Administered 2017-10-15: 100 ug/kg/min via INTRAVENOUS

## 2017-10-15 MED ORDER — LIDOCAINE HCL (PF) 2 % IJ SOLN
INTRAMUSCULAR | Status: DC | PRN
  Administered 2017-10-15: 80 mg via INTRADERMAL

## 2017-10-15 MED ORDER — PROPOFOL 500 MG/50ML IV EMUL
INTRAVENOUS | Status: AC
  Filled 2017-10-15: qty 50

## 2017-10-15 MED ORDER — SODIUM CHLORIDE 0.9 % IV SOLN
INTRAVENOUS | Status: DC
  Administered 2017-10-15 (×2): via INTRAVENOUS

## 2017-10-15 NOTE — Anesthesia Post-op Follow-up Note (Signed)
Anesthesia QCDR form completed.        

## 2017-10-15 NOTE — H&P (Signed)
Daniel Bellows, MD [address], [place], [place], [place], [address] [address], [place], [place], [place], [address] Phone: [phone]  Fax: [phone]  Primary Care Physician:  [name], MD   Pre-Procedure History & Physical: HPI:  Daniel Benson is a 57 y.o. male is here for an colonoscopy.   Past Medical History:  Diagnosis Date  . Chest pain   . ED (erectile dysfunction)   . Heartburn    OCCASIONAL  . Pectus excavatum   . Precancerous lesion    H/O    Past Surgical History:  Procedure Laterality Date  . HERNIA REPAIR     X 2    Prior to Admission medications   Medication Sig Start Date End Date Taking? Authorizing Provider  aspirin 81 MG tablet Take 81 mg by mouth daily.   Yes [provider]  atorvastatin (LIPITOR) 10 MG tablet Take 1 tablet (10 mg total) by mouth daily. 09/15/17  Yes Carmon Ginsberg, PA  BIOTIN PO Take 1 tablet by mouth daily.   Yes [provider]  sildenafil (VIAGRA) 50 MG tablet Take 1 tablet (50 mg total) by mouth daily as needed for erectile dysfunction. 09/15/17  Yes Carmon Ginsberg, PA  valACYclovir (VALTREX) 1000 MG tablet 2 pills every 12 hours for one day at onset of fever blister/cold sore Patient not taking: Reported on 09/15/2017 01/31/16   Carmon Ginsberg, PA    Allergies as of 09/16/2017 - Review Complete 09/15/2017  Allergen Reaction Noted  . Penicillins  05/26/2011    Family History  Problem Relation Age of Onset  . Hypertension Mother   . Diabetes Mother   . Dementia Mother   . Hypertension Father   . Pulmonary fibrosis Father   . Leukemia Brother     Social History   Socioeconomic History  . Marital status: Married    Spouse name: Not on file  . Number of children: Not on file  . Years of education: Not on file  . Highest education level: Not on file  Occupational History  . Occupation: Quarry manager: [employer]  Social Needs  . Financial resource strain: Not on  file  . Food insecurity:    Worry: Not on file    Inability: Not on file  . Transportation needs:    Medical: Not on file    Non-medical: Not on file  Tobacco Use  . Smoking status: Former Smoker    Packs/day: 1.00    Years: 30.00    Pack years: 30.00    Types: Cigarettes    Last attempt to quit: 05/26/2006    Years since quitting: 11.3  . Smokeless tobacco: Never Used  Substance and Sexual Activity  . Alcohol use: Yes    Alcohol/week: 2.0 standard drinks    Types: 2 Cans of beer per week    Comment: OCCASIONAL  . Drug use: No  . Sexual activity: Yes  Lifestyle  . Physical activity:    Days per week: Not on file    Minutes per session: Not on file  . Stress: Not on file  Relationships  . Social connections:    Talks on phone: Not on file    Gets together: Not on file    Attends religious service: Not on file    Active member of club or organization: Not on file    Attends meetings of clubs or organizations: Not on file    Relationship status: Not on file  .  Intimate partner violence:    Fear of current or ex partner: Not on file    Emotionally abused: Not on file    Physically abused: Not on file    Forced sexual activity: Not on file  Other Topics Concern  . Not on file  Social History Narrative  . Not on file    Review of Systems: See HPI, otherwise negative ROS  Physical Exam: BP 129/83   Pulse (!) 49   Temp (!) 96.5 F (35.8 C) (Tympanic)   Resp 18   Ht 5\' 11"  (1.803 m)   Wt 88 kg   SpO2 99%   BMI 27.06 kg/m  General:   Alert,  pleasant and cooperative in NAD Head:  Normocephalic and atraumatic. Neck:  Supple; no masses or thyromegaly. Lungs:  Clear throughout to auscultation, normal respiratory effort.    Heart:  +S1, +S2, Regular rate and rhythm, No edema. Abdomen:  Soft, nontender and nondistended. Normal bowel sounds, without guarding, and without rebound.   Neurologic:  Alert and  oriented x4;  grossly normal  neurologically.  Impression/Plan: [name] is here for an colonoscopy to be performed for surveillance due to prior history of colon polyps   Risks, benefits, limitations, and alternatives regarding  colonoscopy have been reviewed with the patient.  Questions have been answered.  All parties agreeable.   Daniel Bellows, MD  10/15/2017, 8:01 AM

## 2017-10-15 NOTE — Transfer of Care (Signed)
Immediate Anesthesia Transfer of Care Note  Patient: Daniel Benson  Procedure(s) Performed: COLONOSCOPY WITH PROPOFOL (N/A )  Patient Location: PACU  Anesthesia Type:General  Level of Consciousness: awake  Airway & Oxygen Therapy: Patient Spontanous Breathing  Post-op Assessment: Report given to RN and Post -op Vital signs reviewed and stable  Post vital signs: Reviewed  Last Vitals:  Vitals Value Taken Time  BP 125/81 10/15/2017  8:45 AM  Temp 36.1 C 10/15/2017  8:34 AM  Pulse 56 10/15/2017  8:47 AM  Resp 11 10/15/2017  8:47 AM  SpO2 98 % 10/15/2017  8:47 AM  Vitals shown include unvalidated device data.  Last Pain:  Vitals:   10/15/17 0834  TempSrc: Tympanic  PainSc: Asleep         Complications: No apparent anesthesia complications

## 2017-10-15 NOTE — Op Note (Signed)
[facility] Gastroenterology Patient Name: Daniel Benson Procedure Date: 10/15/2017 8:03 AM MRN: [id number] Account #: [id number] Date of Birth: 01/10/1961 Admit Type: Outpatient Age: 57 Room: [facility] ENDO ROOM 3 Gender: Male Note Status: Finalized Procedure:            Colonoscopy Indications:          High risk colon cancer surveillance: Personal history                        of colonic polyps Providers:            Jonathon Bellows MD, MD Referring MD:         Janine Ores. Rosanna Randy, MD (Referring MD) Medicines:            Monitored Anesthesia Care Complications:        No immediate complications. Procedure:            Pre-Anesthesia Assessment:                       - Prior to the procedure, a History and Physical was                        performed, and patient medications, allergies and                        sensitivities were reviewed. The patient's tolerance of                        previous anesthesia was reviewed.                       - The risks and benefits of the procedure and the                        sedation options and risks were discussed with the                        patient. All questions were answered and informed                        consent was obtained.                       - ASA Grade Assessment: II - A patient with mild                        systemic disease.                       After obtaining informed consent, the colonoscope was                        passed under direct vision. Throughout the procedure,                        the patient's blood pressure, pulse, and oxygen                        saturations were monitored continuously. The  Colonoscope was introduced through the anus and                        advanced to the the cecum, identified by the                        appendiceal orifice, IC valve and transillumination.                        The colonoscopy was performed with ease. The patient                   tolerated the procedure well. The quality of the bowel                        preparation was excellent. Findings:      The perianal and digital rectal examinations were normal.      Three sessile polyps were found in the ascending colon. The polyps were       4 to 6 mm in size. These polyps were removed with a cold snare.       Resection and retrieval were complete.      The exam was otherwise without abnormality on direct and retroflexion       views. Impression:           - Three 4 to 6 mm polyps in the ascending colon,                        removed with a cold snare. Resected and retrieved.                       - The examination was otherwise normal on direct and                        retroflexion views. Recommendation:       - Discharge patient to home (with escort).                       - Resume previous diet.                       - Continue present medications.                       - Await pathology results.                       - Repeat colonoscopy in 3 - 5 years for surveillance. Procedure Code(s):    --- Professional ---                       [phone], Colonoscopy, flexible; with removal of tumor(s),                        polyp(s), or other lesion(s) by snare technique Diagnosis Code(s):    --- Professional ---                       Z86.010, Personal history of colonic polyps                       D12.2, Benign neoplasm of ascending colon CPT  copyright 2017 American Medical Association. All rights reserved. The codes documented in this report are preliminary and upon coder review may  be revised to meet current compliance requirements. Jonathon Bellows, MD Jonathon Bellows MD, MD 10/15/2017 8:31:04 AM This report has been signed electronically. Number of Addenda: 0 Note Initiated On: 10/15/2017 8:03 AM Scope Withdrawal Time: 0 hours 16 minutes 50 seconds  Total Procedure Duration: 0 hours 20 minutes 16 seconds       [facility]

## 2017-10-15 NOTE — Anesthesia Postprocedure Evaluation (Signed)
Anesthesia Post Note  Patient: Daniel Benson  Procedure(s) Performed: COLONOSCOPY WITH PROPOFOL (N/A )  Patient location during evaluation: PACU Anesthesia Type: General Level of consciousness: awake and alert Pain management: pain level controlled Vital Signs Assessment: post-procedure vital signs reviewed and stable Respiratory status: spontaneous breathing, nonlabored ventilation and respiratory function stable Cardiovascular status: blood pressure returned to baseline and stable Postop Assessment: no apparent nausea or vomiting Anesthetic complications: no     Last Vitals:  Vitals:   10/15/17 0713 10/15/17 0834  BP: 129/83 115/79  Pulse: (!) 49 61  Resp: 18 14  Temp: (!) 35.8 C (!) 36.1 C  SpO2: 99% 98%    Last Pain:  Vitals:   10/15/17 0834  TempSrc: Tympanic  PainSc: [place]

## 2017-10-15 NOTE — Anesthesia Preprocedure Evaluation (Addendum)
Anesthesia Evaluation  Patient identified by MRN, date of birth, ID band Patient awake    Reviewed: Allergy & Precautions, H&P , NPO status , Patient's Chart, lab work & pertinent test results  Airway Mallampati: III  TM Distance: >3 FB Neck ROM: full    Dental no notable dental hx. (+) Teeth Intact   Pulmonary neg pulmonary ROS, neg COPD, former smoker,           Cardiovascular (-) angina(-) Past MI negative cardio ROS  (-) dysrhythmias      Neuro/Psych negative neurological ROS  negative psych ROS   GI/Hepatic negative GI ROS, Neg liver ROS,   Endo/Other  negative endocrine ROS  Renal/GU negative Renal ROS  negative genitourinary   Musculoskeletal   Abdominal   Peds  Hematology negative hematology ROS (+)   Anesthesia Other Findings Past Medical History: No date: Chest pain No date: ED (erectile dysfunction) No date: Heartburn     Comment:  OCCASIONAL No date: Pectus excavatum No date: Precancerous lesion     Comment:  H/O  Past Surgical History: No date: HERNIA REPAIR     Comment:  X 2     Reproductive/Obstetrics negative OB ROS                            Anesthesia Physical Anesthesia Plan  ASA: I  Anesthesia Plan: General   Post-op Pain Management:    Induction:   PONV Risk Score and Plan: Propofol infusion  Airway Management Planned: Natural Airway and Nasal Cannula  Additional Equipment:   Intra-op Plan:   Post-operative Plan:   Informed Consent: I have reviewed the patients History and Physical, chart, labs and discussed the procedure including the risks, benefits and alternatives for the proposed anesthesia with the patient or authorized representative who has indicated his/her understanding and acceptance.   Dental Advisory Given  Plan Discussed with: Anesthesiologist, CRNA and Surgeon  Anesthesia Plan Comments:        Anesthesia Quick  Evaluation

## 2017-10-16 ENCOUNTER — Encounter: Payer: Self-pay | Admitting: Gastroenterology

## 2017-10-17 LAB — SURGICAL PATHOLOGY

## 2017-10-18 ENCOUNTER — Encounter: Payer: Self-pay | Admitting: Gastroenterology

## 2017-12-24 DIAGNOSIS — Z1283 Encounter for screening for malignant neoplasm of skin: Secondary | ICD-10-CM | POA: Diagnosis not present

## 2017-12-24 DIAGNOSIS — D2230 Melanocytic nevi of unspecified part of face: Secondary | ICD-10-CM | POA: Diagnosis not present

## 2017-12-24 DIAGNOSIS — B078 Other viral warts: Secondary | ICD-10-CM | POA: Diagnosis not present

## 2017-12-24 DIAGNOSIS — L570 Actinic keratosis: Secondary | ICD-10-CM | POA: Diagnosis not present

## 2017-12-24 DIAGNOSIS — L821 Other seborrheic keratosis: Secondary | ICD-10-CM | POA: Diagnosis not present

## 2018-01-04 ENCOUNTER — Encounter: Payer: Self-pay | Admitting: Family Medicine

## 2018-01-04 ENCOUNTER — Ambulatory Visit (INDEPENDENT_AMBULATORY_CARE_PROVIDER_SITE_OTHER): Payer: 59 | Admitting: Family Medicine

## 2018-01-04 VITALS — BP 116/82 | HR 52 | Temp 98.0°F | Resp 16 | Wt 200.4 lb

## 2018-01-04 DIAGNOSIS — R002 Palpitations: Secondary | ICD-10-CM | POA: Diagnosis not present

## 2018-01-04 NOTE — Patient Instructions (Signed)
Stop caffeinated beverages. We will call you with the lab work.

## 2018-01-04 NOTE — Progress Notes (Signed)
  Subjective:     Patient ID: Daniel Benson, male   DOB: 1961-01-13, 57 y.o.   MRN: [id number] Chief Complaint  Patient presents with  . Irregular Heart Beat    Patient comes in office today with concerns of irregular heart rate that he first noticed yesterday morning upon awakening. Patient denies being under any stress, shortness of breath, chest pain or new medication.    HPI States he drinks 20-40 oz of Pepsi daily but no coffee. Hx of pectus excavatum and cardiology evaluation for abnormal EKG in 2014 [facility]).  Review of Systems     Objective:   Physical Exam  Constitutional: He appears well-developed and well-nourished. No distress.  Cardiovascular: Regular rhythm.  Extra beats heard with compensatory pause.  Pulmonary/Chest: Breath sounds normal.       Assessment:    1. Palpitations - EKG 12-Lead - Renal function panel - TSH - Magnesium    Plan:    Further f/u pending lab work.

## 2018-01-05 ENCOUNTER — Telehealth: Payer: Self-pay

## 2018-01-05 LAB — RENAL FUNCTION PANEL
Albumin: 4.1 g/dL (ref 3.5–5.5)
BUN / CREAT RATIO: 13 (ref 9–20)
BUN: 15 mg/dL (ref 6–24)
CO2: 27 mmol/L (ref 20–29)
CREATININE: 1.15 mg/dL (ref 0.76–1.27)
Calcium: 9 mg/dL (ref 8.7–10.2)
Chloride: 105 mmol/L (ref 96–106)
GFR, EST AFRICAN AMERICAN: 81 mL/min/{1.73_m2} (ref >59)
GFR, EST NON AFRICAN AMERICAN: 70 mL/min/{1.73_m2} (ref >59)
Glucose: 79 mg/dL (ref 65–99)
Phosphorus: 2.7 mg/dL (ref 2.5–4.5)
Potassium: 4.3 mmol/L (ref 3.5–5.2)
SODIUM: 144 mmol/L (ref 134–144)

## 2018-01-05 LAB — MAGNESIUM: MAGNESIUM: 2.1 mg/dL (ref 1.6–2.3)

## 2018-01-05 LAB — TSH: TSH: 1.52 u[IU]/mL (ref 0.450–4.500)

## 2018-01-05 NOTE — Telephone Encounter (Signed)
-----   Message from Carmon Ginsberg, [place] sent at 01/05/2018  9:35 AM EST ----- Labs are ok. Let me know how you are doing off of caffeine over the next few days.

## 2018-01-05 NOTE — Telephone Encounter (Signed)
Patient was advised. KW 

## 2018-01-11 ENCOUNTER — Other Ambulatory Visit: Payer: Self-pay | Admitting: Family Medicine

## 2018-01-11 DIAGNOSIS — I499 Cardiac arrhythmia, unspecified: Secondary | ICD-10-CM

## 2018-01-11 NOTE — Progress Notes (Unsigned)
ref

## 2018-02-22 DIAGNOSIS — R002 Palpitations: Secondary | ICD-10-CM | POA: Insufficient documentation

## 2018-02-22 NOTE — Progress Notes (Signed)
Cardiology Office Note  Date:  02/23/2018   ID:  CLETE KUCH, DOB 13-Mar-1960, MRN [id number]  PCP:  [name], MD   Chief Complaint  Patient presents with  . Other     per [facility] for Irregular cardiac rhythm. Patient denies any chest pain and SOB at this time. Meds reviewed verbally with patient.     HPI:  Mr. Carsey is a very pleasant 57 year old gentleman with pectus excavatum  30 year smoking history, stopped in 2008 ,  previously with shortness of breath, improved since he stopped smoking,  also with previous rare episodes of left-sided chest pain and jaw pain typically at rest  Referred by Carmon Ginsberg for consultation of his irregular heart rhythm  works at [employer] at baseline Recently laying his head on the pillow appreciated dropped beats, pauses When sitting up and during the daytime does not appreciate any palpitations  No near syncope or syncope Only appreciates irregular rhythm when he checks his radial artery pulse  Drinks lots of Peps[employer] daily Trying to cut back  EKG personally reviewed by myself on todays visit This shows NSR with PVCs, ventricular rate 64 bpm poor R wave progression to the anterior precordial leads left axis deviation   PMH:   has a past medical history of Chest pain, ED (erectile dysfunction), Heartburn, Pectus excavatum, and Precancerous lesion.  PSH:    Past Surgical History:  Procedure Laterality Date  . COLONOSCOPY WITH PROPOFOL N/A 10/15/2017   Procedure: COLONOSCOPY WITH PROPOFOL;  Surgeon: Jonathon Bellows, MD;  Location: [facility] ENDOSCOPY;  Service: Gastroenterology;  Laterality: N/A;  . HERNIA REPAIR     X 2    Current Outpatient Medications  Medication Sig Dispense Refill  . aspirin 81 MG tablet Take 81 mg by mouth daily.    Marland Kitchen atorvastatin (LIPITOR) 10 MG tablet Take 1 tablet (10 mg total) by mouth daily. 90 tablet 3  . BIOTIN PO Take 1 tablet by mouth daily.    . sildenafil (VIAGRA) 50 MG tablet  Take 1 tablet (50 mg total) by mouth daily as needed for erectile dysfunction. 10 tablet 5  . valACYclovir (VALTREX) 1000 MG tablet 2 pills every 12 hours for one day at onset of fever blister/cold sore 12 tablet 1   No current facility-administered medications for this visit.      Allergies:   Penicillins   Social History:  The patient  reports that he quit smoking about 11 years ago. His smoking use included cigarettes. He has a 30.00 pack-year smoking history. He has never used smokeless tobacco. He reports current alcohol use of about 2.0 standard drinks of alcohol per week. He reports that he does not use drugs.   Family History:   family history includes Dementia in his mother; Diabetes in his mother; Hypertension in his father and mother; Leukemia in his brother; Pulmonary fibrosis in his father.    Review of Systems: Review of Systems  Constitutional: Negative.   Respiratory: Negative.   Cardiovascular: Negative.   Gastrointestinal: Negative.   Musculoskeletal: Negative.   Neurological: Negative.   Psychiatric/Behavioral: Negative.   All other systems reviewed and are negative.    PHYSICAL EXAM: VS:  BP 130/80 (BP Location: Left Arm, Patient Position: Sitting, Cuff Size: Normal)   Pulse 64   Ht 5\' 9"  (1.753 m)   Wt 196 lb 4 oz (89 kg)   BMI 28.98 kg/m  , BMI Body mass index is 28.98 kg/m. GEN:  Well nourished, well developed, in no acute distress  HEENT: normal  Neck: no JVD, carotid bruits, or masses Cardiac: RRR; no murmurs, rubs, or gallops,no edema  Ectopy appreciated Respiratory:  clear to auscultation bilaterally, normal work of breathing GI: soft, nontender, nondistended, + BS MS: no deformity or atrophy  Skin: warm and dry, no rash Neuro:  Strength and sensation are intact Psych: euthymic mood, full affect   Recent Labs: 09/15/2017: ALT 19 01/04/2018: BUN 15; Creatinine, Ser 1.15; Magnesium 2.1; Potassium 4.3; Sodium 144; TSH 1.520    Lipid  Panel Lab Results  Component Value Date   CHOL 130 09/15/2017   HDL 54 09/15/2017   LDLCALC 62 09/15/2017   TRIG 69 09/15/2017      Wt Readings from Last 3 Encounters:  02/23/18 196 lb 4 oz (89 kg)  01/04/18 200 lb 6.4 oz (90.9 kg)  10/15/17 194 lb (88 kg)      ASSESSMENT AND PLAN:  Frequent PVCs Relatively asymptomatic, frequent PVCs on EKG today He prefers not to take medication such as beta-blockers for his PVCs Discussed mechanism, etiology in detail Work-up would include repeat echocardiogram to confirm normal ejection fraction We did offer event monitor to determine PVC burden He prefers just to watch them for now  Palpitations - Plan: EKG 12-Lead, ECHOCARDIOGRAM COMPLETE Secondary to PVCs as detailed above  Pectus excavatum Has been relatively asymptomatic Likely unrelated to ectopy above  Mixed hyperlipidemia - Plan: EKG 12-Lead Cholesterol is at goal on the current lipid regimen. No changes to the medications were made.  Disposition:   F/U as needed We will call him with the results of his echocardiogram   Total encounter time more than 60 minutes  Greater than 50% was spent in counseling and coordination of care with the patient  Patient was seen in consultation for Carmon Ginsberg and will be referred back to his office for ongoing care of the issues detailed above  Orders Placed This Encounter  Procedures  . EKG 12-Lead  . ECHOCARDIOGRAM COMPLETE     Signed, Esmond Plants, M.D., Ph.D. 02/23/2018  [place], [place]

## 2018-02-23 ENCOUNTER — Ambulatory Visit (INDEPENDENT_AMBULATORY_CARE_PROVIDER_SITE_OTHER): Payer: 59 | Admitting: Cardiovascular Disease

## 2018-02-23 ENCOUNTER — Encounter: Payer: Self-pay | Admitting: Cardiovascular Disease

## 2018-02-23 VITALS — BP 130/80 | HR 64 | Ht 69.0 in | Wt 196.2 lb

## 2018-02-23 DIAGNOSIS — E782 Mixed hyperlipidemia: Secondary | ICD-10-CM

## 2018-02-23 DIAGNOSIS — Q676 Pectus excavatum: Secondary | ICD-10-CM | POA: Diagnosis not present

## 2018-02-23 DIAGNOSIS — R002 Palpitations: Secondary | ICD-10-CM

## 2018-02-23 DIAGNOSIS — I493 Ventricular premature depolarization: Secondary | ICD-10-CM

## 2018-02-23 NOTE — Patient Instructions (Addendum)
Medication Instructions:  No changes  If you need a refill on your cardiac medications before your next appointment, please call your pharmacy.    Lab work: No new labs needed   If you have labs (blood work) drawn today and your tests are completely normal, you will receive your results only by: Marland Kitchen MyChart Message (if you have MyChart) OR . A paper copy in the mail If you have any lab test that is abnormal or we need to change your treatment, we will call you to review the results.   Testing/Procedures:  We will order an echocardiogram for palpitations, frequent PVCs   Follow-Up: At [facility], you and your health needs are our priority.  As part of our continuing mission to provide you with exceptional heart care, we have created designated Provider Care Teams.  These Care Teams include your primary Cardiologist (physician) and Advanced Practice Providers (APPs -  Physician Assistants and Nurse Practitioners) who all work together to provide you with the care you need, when you need it.  . You will need a follow up appointment as needed  . Providers on your designated Care Team:   . Murray Hodgkins, NP . Christell Faith, PA-C . Marrianne Mood, PA-C  Any Other Special Instructions Will Be Listed Below (If Applicable).  For educational health videos Log in to : [email or web address] Or : [email or web address], password : [email or web address]

## 2018-02-26 ENCOUNTER — Other Ambulatory Visit: Payer: Self-pay | Admitting: Cardiovascular Disease

## 2018-02-26 DIAGNOSIS — I493 Ventricular premature depolarization: Secondary | ICD-10-CM

## 2018-03-12 ENCOUNTER — Other Ambulatory Visit: Payer: 59

## 2018-05-14 ENCOUNTER — Other Ambulatory Visit: Payer: Self-pay

## 2018-05-14 ENCOUNTER — Encounter: Payer: Self-pay | Admitting: Physician Assistant

## 2018-05-14 ENCOUNTER — Ambulatory Visit (INDEPENDENT_AMBULATORY_CARE_PROVIDER_SITE_OTHER): Payer: 59 | Admitting: Physician Assistant

## 2018-05-14 VITALS — BP 127/85 | HR 46 | Temp 98.6°F | Resp 16 | Wt 203.0 lb

## 2018-05-14 DIAGNOSIS — H918X2 Other specified hearing loss, left ear: Secondary | ICD-10-CM | POA: Diagnosis not present

## 2018-05-14 DIAGNOSIS — H9312 Tinnitus, left ear: Secondary | ICD-10-CM | POA: Diagnosis not present

## 2018-05-14 MED ORDER — PREDNISONE 10 MG (21) PO TBPK: ORAL_TABLET | ORAL | 0 refills | Status: DC

## 2018-05-14 NOTE — Patient Instructions (Signed)
Tinnitus  Tinnitus refers to hearing a sound when there is no actual source for that sound. This is often described as ringing in the ears. However, people with this condition may hear a variety of noises, in one ear or in both ears.  The sounds of tinnitus can be soft, loud, or somewhere in between. Tinnitus can last for a few seconds or can be constant for days. It may go away without treatment and come back at various times. When tinnitus is constant or happens often, it can lead to other problems, such as trouble sleeping and trouble concentrating.  Almost everyone experiences tinnitus at some point. Tinnitus that is long-lasting (chronic) or comes back often (recurs) may require medical attention.  What are the causes?  The cause of tinnitus is often not known. In some cases, it can result from other problems or conditions, including:  · Exposure to loud noises from machinery, music, or other sources.  · Hearing loss.  · Ear or sinus infections.  · Earwax buildup.  · An object (foreign body) stuck in the ear.  · Taking certain medicines.  · Drinking alcohol or caffeine.  · High blood pressure.  · Heart diseases.  · Anemia.  · Allergies.  · Meniere's disease.  · Thyroid problems.  · Tumors.  · A weak, bulging blood vessel (aneurysm) near the ear.  · Depression or other mood disorders.  What are the signs or symptoms?  The main symptom of tinnitus is hearing a sound when there is no source for that sound. It may sound like:  · Buzzing.  · Roaring.  · Ringing.  · Blowing air, like the sound heard when you listen to a seashell.  · Hissing.  · Whistling.  · Sizzling.  · Humming.  · Running water.  · A musical note.  · Tapping.  Symptoms may affect only one ear (unilateral) or both ears (bilateral).  How is this diagnosed?  Tinnitus is diagnosed based on your symptoms, your medical history, and a physical exam. Your health care provider may do a thorough hearing test (audiologic exam) if your tinnitus:  · Is  unilateral.  · Causes hearing difficulties.  · Lasts 6 months or longer.  You may work with a health care provider who specializes in hearing disorders (audiologist). You may be asked questions about your symptoms and how they affect your daily life. You may have other tests done, such as:  · CT scan.  · MRI.  · An imaging test of how blood flows through your blood vessels (angiogram).  How is this treated?  Treating an underlying medical condition can sometimes make tinnitus go away. If your tinnitus continues, other treatments may include:  · Medicines, such as antidepressants or sleeping aids.  · Sound generators to mask the tinnitus. These include:  ? Tabletop sound machines that play relaxing sounds to help you fall asleep.  ? Wearable devices that fit in your ear and play sounds or music.  ? Acoustic neural stimulation. This involves using headphones to listen to music that contains an auditory signal. Over time, listening to this signal may change some pathways in your brain and make you less sensitive to tinnitus. This treatment is used for very severe cases when no other treatment is working.  · Therapy and counseling to help you manage the stress of living with tinnitus.  · Using hearing aids or cochlear implants if your tinnitus is related to hearing loss. Hearing aids are worn in   the outer ear. Cochlear implants are surgically placed in the inner ear.  Follow these instructions at home:  Managing symptoms         · When possible, avoid being in loud places and being exposed to loud sounds.  · Wear hearing protection, such as earplugs, when you are exposed to loud noises.  · Use a white noise machine, a humidifier, or other devices to mask the sound of tinnitus.  · Practice techniques for reducing stress, such as meditation, yoga, or deep breathing. Work with your health care provider if you need help with managing stress.  · Sleep with your head slightly raised. This may reduce the impact of  tinnitus.  General instructions  · Do not use stimulants, such as nicotine, alcohol, or caffeine. Talk with your health care provider about other stimulants to avoid. Stimulants are substances that can make you feel alert and attentive by increasing certain activities in the body (such as heart rate and blood pressure). These substances may make tinnitus worse.  · Take over-the-counter and prescription medicines only as told by your health care provider.  · Try to get plenty of sleep each night.  · Keep all follow-up visits as told by your health care provider. This is important.  Contact a health care provider if:  · Your tinnitus continues for 3 weeks or longer without stopping.  · Your symptoms get worse or do not get better with home care.  · You develop tinnitus after a head injury.  · You have tinnitus along with any of the following:  ? Dizziness.  ? Loss of balance.  ? Nausea and vomiting.  Summary  · Tinnitus refers to hearing a sound when there is no actual source for that sound. This is often described as ringing in the ears.  · Symptoms may affect only one ear (unilateral) or both ears (bilateral).  · Use a white noise machine, a humidifier, or other devices to mask the sound of tinnitus.  · Do not use stimulants, such as nicotine, alcohol, or caffeine. Talk with your health care provider about other stimulants to avoid. These substances may make tinnitus worse.  This information is not intended to replace advice given to you by your health care provider. Make sure you discuss any questions you have with your health care provider.  Document Released: 02/10/2005 Document Revised: 11/20/2016 Document Reviewed: 11/20/2016  Elsevier Interactive Patient Education © 2019 [employer] Inc[employer]

## 2018-05-14 NOTE — Progress Notes (Signed)
Patient: Daniel Benson Male    DOB: May 18, 1960   58 y.o.   MRN: [id number] Visit Date: 05/14/2018  Today's Provider: Mar Daring, PA-C   Chief Complaint  Patient presents with  . Ear Problem   Subjective:     HPI Patient here today c/o left ear making a "roaring" noise since last week. Patient reports that today he is having problems hearing. Patient reports he did use some drops and denies any earwax coming out. Patient reports some allergies and has been taking sudafed daily in the last week.    Allergies  Allergen Reactions  . Penicillins     Stomach upset     Current Outpatient Medications:  .  aspirin 81 MG tablet, Take 81 mg by mouth daily., Disp: , Rfl:  .  atorvastatin (LIPITOR) 10 MG tablet, Take 1 tablet (10 mg total) by mouth daily., Disp: 90 tablet, Rfl: 3 .  BIOTIN PO, Take 1 tablet by mouth daily., Disp: , Rfl:  .  sildenafil (VIAGRA) 50 MG tablet, Take 1 tablet (50 mg total) by mouth daily as needed for erectile dysfunction., Disp: 10 tablet, Rfl: 5 .  valACYclovir (VALTREX) 1000 MG tablet, 2 pills every 12 hours for one day at onset of fever blister/cold sore, Disp: 12 tablet, Rfl: 1 .  predniSONE (STERAPRED UNI-PAK 21 TAB) 10 MG (21) TBPK tablet, 6 day taper; take as directed on package instructions, Disp: 21 tablet, Rfl: 0  Review of Systems  Constitutional: Negative.   HENT: Positive for ear pain, hearing loss and tinnitus. Negative for facial swelling.   Respiratory: Negative.   Cardiovascular: Negative.   Gastrointestinal: Negative.   Neurological: Negative for dizziness, light-headedness and headaches.    Social History   Tobacco Use  . Smoking status: Former Smoker    Packs/day: 1.00    Years: 30.00    Pack years: 30.00    Types: Cigarettes    Last attempt to quit: 05/26/2006    Years since quitting: 11.9  . Smokeless tobacco: Never Used  Substance Use Topics  . Alcohol use: Yes    Alcohol/week: 2.0 standard drinks   Types: 2 Cans of beer per week    Comment: OCCASIONAL      Objective:   BP 127/85 (BP Location: Left Arm, Patient Position: Sitting, Cuff Size: Large)   Pulse (!) 46   Temp 98.6 F (37 C) (Oral)   Resp 16   Wt 203 lb (92.1 kg)   SpO2 97%   BMI 29.98 kg/m  Vitals:   05/14/18 1600  BP: 127/85  Pulse: (!) 46  Resp: 16  Temp: 98.6 F (37 C)  TempSrc: Oral  SpO2: 97%  Weight: 203 lb (92.1 kg)     Physical Exam Vitals signs reviewed.  Constitutional:      General: He is not in acute distress.    Appearance: Normal appearance. He is well-developed. He is not ill-appearing or diaphoretic.  HENT:     Head: Normocephalic and atraumatic.     Right Ear: Hearing, tympanic membrane, ear canal and external ear normal. No middle ear effusion. Tympanic membrane is not erythematous or bulging.     Left Ear: Hearing, tympanic membrane, ear canal and external ear normal.  No middle ear effusion. Tympanic membrane is not erythematous or bulging.     Nose: No mucosal edema, congestion or rhinorrhea.     Right Sinus: No maxillary sinus tenderness or frontal sinus tenderness.  Left Sinus: No maxillary sinus tenderness or frontal sinus tenderness.     Mouth/Throat:     Mouth: Mucous membranes are moist.     Pharynx: Uvula midline. No oropharyngeal exudate or posterior oropharyngeal erythema.  Eyes:     General:        Right eye: No discharge.        Left eye: No discharge.     Conjunctiva/sclera: Conjunctivae normal.     Pupils: Pupils are equal, round, and reactive to light.  Neck:     Musculoskeletal: Normal range of motion and neck supple.     Thyroid: No thyromegaly.     Trachea: No tracheal deviation.     Meningeal: Brudzinski's sign and Kernig's sign absent.  Cardiovascular:     Rate and Rhythm: Normal rate and regular rhythm.     Heart sounds: Normal heart sounds. No murmur. No friction rub. No gallop.   Pulmonary:     Effort: Pulmonary effort is normal. No respiratory  distress.     Breath sounds: Normal breath sounds. No stridor. No wheezing or rales.  Lymphadenopathy:     Cervical: No cervical adenopathy.  Skin:    General: Skin is warm and dry.  Neurological:     Mental Status: He is alert.         Assessment & Plan    1. Tinnitus of left ear No cerumen or ETD noted. No acute loud noises prior to hearing loss. No dizziness or vertigo. Has had previous lifetime exposure to shooting guns and loud machines at work. Will treat with prednisone as below for possible labyrinthitis. If no improvements in hearing follow through with ENT referral for further hearing evaluation.   - predniSONE (STERAPRED UNI-PAK 21 TAB) 10 MG (21) TBPK tablet; 6 day taper; take as directed on package instructions  Dispense: 21 tablet; Refill: 0 - Ambulatory referral to ENT  2. Other specified hearing loss of left ear, unspecified hearing status on contralateral side See above medical treatment plan. - Ambulatory referral to ENT'    Mar Daring, PA-C  [place]

## 2018-05-19 ENCOUNTER — Other Ambulatory Visit: Payer: 59

## 2018-05-19 ENCOUNTER — Telehealth: Payer: Self-pay | Admitting: Cardiovascular Disease

## 2018-05-19 NOTE — Telephone Encounter (Signed)
Patient arrived for his echocardiogram and reviewed that due to virus they had tried to reach out to him to reschedule. Obtained his name, date of birth, and he provided me with two numbers to please call back for rescheduling. Advised they may be able to do later today per Dr. Rockey Situ but that I would have someone reach out to him to assist with this. He was very appreciative with no further questions at this time. Numbers he provided were [phone] & [phone].

## 2018-05-19 NOTE — Telephone Encounter (Signed)
Rescheduled .  Thanks Pam

## 2018-07-08 ENCOUNTER — Ambulatory Visit (INDEPENDENT_AMBULATORY_CARE_PROVIDER_SITE_OTHER): Payer: 59

## 2018-07-08 ENCOUNTER — Other Ambulatory Visit: Payer: Self-pay

## 2018-07-08 DIAGNOSIS — I493 Ventricular premature depolarization: Secondary | ICD-10-CM

## 2018-07-08 NOTE — Progress Notes (Unsigned)
   Patient ID: Daniel Benson, male    DOB: 22-Apr-1960, 58 y.o.   MRN: [id number]  Dr Fletcher Anon told of preliminary echo results before patient left. Dr Rockey Situ notified via [place]. Patient stated that he was asymptomatic     Review of Systems    Physical Exam

## 2018-07-12 ENCOUNTER — Other Ambulatory Visit: Payer: Self-pay

## 2018-07-12 ENCOUNTER — Telehealth: Payer: Self-pay

## 2018-07-12 ENCOUNTER — Telehealth: Payer: Self-pay | Admitting: *Deleted

## 2018-07-12 ENCOUNTER — Encounter: Payer: Self-pay | Admitting: *Deleted

## 2018-07-12 ENCOUNTER — Other Ambulatory Visit
Admission: RE | Admit: 2018-07-12 | Discharge: 2018-07-12 | Disposition: A | Discharge status: Home / Self Care | Payer: 59 | Source: Ambulatory Visit | Attending: Cardiovascular Disease | Admitting: Cardiovascular Disease

## 2018-07-12 DIAGNOSIS — Z01812 Encounter for preprocedural laboratory examination: Secondary | ICD-10-CM | POA: Diagnosis not present

## 2018-07-12 DIAGNOSIS — I420 Dilated cardiomyopathy: Secondary | ICD-10-CM | POA: Insufficient documentation

## 2018-07-12 DIAGNOSIS — Z7982 Long term (current) use of aspirin: Secondary | ICD-10-CM | POA: Diagnosis not present

## 2018-07-12 DIAGNOSIS — R931 Abnormal findings on diagnostic imaging of heart and coronary circulation: Secondary | ICD-10-CM | POA: Diagnosis present

## 2018-07-12 DIAGNOSIS — Z1159 Encounter for screening for other viral diseases: Secondary | ICD-10-CM | POA: Diagnosis not present

## 2018-07-12 DIAGNOSIS — Z87891 Personal history of nicotine dependence: Secondary | ICD-10-CM | POA: Diagnosis not present

## 2018-07-12 DIAGNOSIS — Q676 Pectus excavatum: Secondary | ICD-10-CM | POA: Diagnosis not present

## 2018-07-12 DIAGNOSIS — Z79899 Other long term (current) drug therapy: Secondary | ICD-10-CM | POA: Diagnosis not present

## 2018-07-12 LAB — BASIC METABOLIC PANEL
Anion gap: 8 (ref 5–15)
BUN: 22 mg/dL — ABNORMAL HIGH (ref 6–20)
CO2: 22 mmol/L (ref 22–32)
Calcium: 9 mg/dL (ref 8.9–10.3)
Chloride: 109 mmol/L (ref 98–111)
Creatinine, Ser: 1.17 mg/dL (ref 0.61–1.24)
GFR calc Af Amer: >60 mL/min (ref >60)
GFR calc non Af Amer: >60 mL/min (ref >60)
Glucose, Bld: 102 mg/dL — ABNORMAL HIGH (ref 70–99)
Potassium: 4 mmol/L (ref 3.5–5.1)
Sodium: 139 mmol/L (ref 135–145)

## 2018-07-12 LAB — CBC WITH DIFFERENTIAL/PLATELET
Abs Immature Granulocytes: 0.02 10*3/uL (ref 0.00–0.07)
Basophils Absolute: 0.1 10*3/uL (ref 0.0–0.1)
Basophils Relative: 1 %
Eosinophils Absolute: 0.2 10*3/uL (ref 0.0–0.5)
Eosinophils Relative: 2 %
HCT: 41.6 % (ref 39.0–52.0)
Hemoglobin: 14.8 g/dL (ref 13.0–17.0)
Immature Granulocytes: 0 %
Lymphocytes Relative: 26 %
Lymphs Abs: 2 10*3/uL (ref 0.7–4.0)
MCH: 32.3 pg (ref 26.0–34.0)
MCHC: 35.6 g/dL (ref 30.0–36.0)
MCV: 90.8 fL (ref 80.0–100.0)
Monocytes Absolute: 0.5 10*3/uL (ref 0.1–1.0)
Monocytes Relative: 7 %
Neutro Abs: 4.8 10*3/uL (ref 1.7–7.7)
Neutrophils Relative %: 64 %
Platelets: 224 10*3/uL (ref 150–400)
RBC: 4.58 MIL/uL (ref 4.22–5.81)
RDW: 12.3 % (ref 11.5–15.5)
WBC: 7.5 10*3/uL (ref 4.0–10.5)
nRBC: 0 % (ref 0.0–0.2)

## 2018-07-12 NOTE — Telephone Encounter (Signed)
-----   Message from Minna Merritts, MD sent at 07/11/2018  1:05 PM EDT ----- Message sent to pam, need webvisit on Tuesday am, or early Monday Am

## 2018-07-12 NOTE — Telephone Encounter (Signed)
Spoke with patient to see if he has had his testing done yet. He was confused on which sites to go to first. Explained that due to [employer] we have some extra guidelines before procedures. He verbalized understanding and then we somehow got disconnected. I will call him back later to review day, time, and location for his procedure later this week. Also will confirm appointment tomorrow with consent for virtual visit.

## 2018-07-12 NOTE — Telephone Encounter (Signed)

## 2018-07-12 NOTE — Telephone Encounter (Signed)
-----   Message from [name], [place] sent at 07/12/2018 10:20 AM EDT ----- Already completed. Scheduled for 07/13/2018 @ 1:40 ----- Message ----- From: Valora Corporal, RN Sent: 07/12/2018   9:29 AM EDT To: Sylvester know there are tons of things going on. Could you assist with this? Thanks  ----- Message ----- From: Minna Merritts, MD Sent: 07/11/2018   1:05 PM EDT To: Valora Corporal, RN  Echo Cardiac function is moderately depressed, etiology unclear though concerning for a heart blockage Front part of the heart not moving well Can we set up a tele visit on Tuesday Am,  Will likely need cardiac cath , possibly later this week

## 2018-07-12 NOTE — Telephone Encounter (Signed)
Called patient.  Patient scheduled for 07/13/2018 @ 1:40am.

## 2018-07-12 NOTE — Progress Notes (Signed)
Virtual Visit via Video Note   This visit type was conducted due to national recommendations for restrictions regarding the COVID-19 Pandemic (e.g. social distancing) in an effort to limit this patient's exposure and mitigate transmission in our community.  Due to his co-morbid illnesses, this patient is at least at moderate risk for complications without adequate follow up.  This format is felt to be most appropriate for this patient at this time.  All issues noted in this document were discussed and addressed.  A limited physical exam was performed with this format.  Please refer to the patient's chart for his consent to telehealth for [facility].   I connected with  Daniel Benson on 07/13/18 by a video enabled telemedicine application and verified that I am speaking with the correct person using two identifiers. I discussed the limitations of evaluation and management by telemedicine. The patient expressed understanding and agreed to proceed.   Evaluation Performed:  Follow-up visit  Date:  07/13/2018   ID:  Daniel Benson, DOB [birth year], MRN [id number]  Patient Location:  [place] [address]   Provider location:   [facility], [place] office  PCP:  [name], MD  Cardiologist:  Arvid Right [facility]   Chief Complaint: Presenting to discuss echocardiogram findings, prior chest pain    History of Present Illness:    Daniel Benson is a 59 y.o. male who presents via audio/video conferencing for a telehealth visit today.   The patient does not symptoms concerning for COVID-19 infection (fever, chills, cough, or new SHORTNESS OF BREATH).   Patient has a past medical history of pectus excavatum  30 year smoking history, stopped in 2008 ,  previously with shortness of breath, improved since he stopped smoking,  also with previous rare episodes of left-sided chest pain and jaw pain typically at rest  Who presents for  follow-up of his cardiomyopathy ejection fraction 30 to 35%, chest pain symptoms  On his last clinic visit echocardiogram ordered for chest pain, shortness of breath Echo 07/08/2018 discussed with him in detail  1. The left ventricle has moderate-severely reduced systolic function, with an ejection fraction of 30-35%. The cavity size was moderately dilated. Unable to exclude anterior and anteroseptal wall hypokinesis. Left ventricular diastolic Doppler  parameters are consistent with impaired relaxation.  2. RV function mildly reduced.  3. Left atrial size was mildly dilated.   works at [employer]  Some shortness of breath chest discomfort concerning for angina Feels palpitations sometimes at nighttime No regular exercise program but does stay active   Prior CV studies:   The following studies were reviewed today:    Past Medical History:  Diagnosis Date  . Chest pain   . ED (erectile dysfunction)   . Heartburn    OCCASIONAL  . Hx of dysplastic nevus 06/14/2003   multiple sites   . Pectus excavatum   . Precancerous lesion    H/O   Past Surgical History:  Procedure Laterality Date  . COLONOSCOPY WITH PROPOFOL N/A 10/15/2017   Procedure: COLONOSCOPY WITH PROPOFOL;  Surgeon: Jonathon Bellows, MD;  Location: [facility] ENDOSCOPY;  Service: Gastroenterology;  Laterality: N/A;  . HERNIA REPAIR     X 2     No outpatient medications have been marked as taking for the 07/13/18 encounter (Telemedicine) with Minna Merritts, MD.     Allergies:   Penicillins   Social History   Tobacco Use  .  Smoking status: Former Smoker    Packs/day: 1.00    Years: 30.00    Pack years: 30.00    Types: Cigarettes    Last attempt to quit: 05/26/2006    Years since quitting: 12.1  . Smokeless tobacco: Never Used  Substance Use Topics  . Alcohol use: Yes    Alcohol/week: 2.0 standard drinks    Types: 2 Cans of beer per week    Comment: OCCASIONAL  . Drug use: No     Current Outpatient Medications  on File Prior to Visit  Medication Sig Dispense Refill  . acetaminophen (TYLENOL) 500 MG tablet Take 500-1,000 mg by mouth every 6 (six) hours as needed (for pain.).    Marland Kitchen aspirin EC 81 MG tablet Take 81 mg by mouth every evening.    Marland Kitchen atorvastatin (LIPITOR) 10 MG tablet Take 1 tablet (10 mg total) by mouth daily. (Patient taking differently: Take 10 mg by mouth every evening. ) 90 tablet 3  . Biotin 1000 MCG tablet Take 1,000 mcg by mouth every evening.    Marland Kitchen ibuprofen (ADVIL) 200 MG tablet Take 400 mg by mouth every 8 (eight) hours as needed (for pain.).    Marland Kitchen naproxen sodium (ALEVE) 220 MG tablet Take 220 mg by mouth 2 (two) times daily as needed (pain.).    Marland Kitchen predniSONE (STERAPRED UNI-PAK 21 TAB) 10 MG (21) TBPK tablet 6 day taper; take as directed on package instructions (Patient not taking: Reported on 07/12/2018) 21 tablet 0  . sildenafil (VIAGRA) 50 MG tablet Take 1 tablet (50 mg total) by mouth daily as needed for erectile dysfunction. 10 tablet 5  . valACYclovir (VALTREX) 1000 MG tablet 2 pills every 12 hours for one day at onset of fever blister/cold sore (Patient taking differently: Take 1,000 mg by mouth 2 (two) times daily as needed (cold sores/fever blisters.). ) 12 tablet 1   No current facility-administered medications on file prior to visit.      Family Hx: The patient's family history includes Dementia in his mother; Diabetes in his mother; Hypertension in his father and mother; Leukemia in his brother; Pulmonary fibrosis in his father.  ROS:   Please see the history of present illness.    Review of Systems  Constitutional: Negative.   Respiratory: Positive for shortness of breath.   Cardiovascular: Positive for chest pain.  Gastrointestinal: Negative.   Musculoskeletal: Negative.   Neurological: Negative.   Psychiatric/Behavioral: Negative.   All other systems reviewed and are negative.     Labs/Other Tests and Data Reviewed:    Recent Labs: 09/15/2017: ALT 19  01/04/2018: Magnesium 2.1; TSH 1.520 07/12/2018: BUN 22; Creatinine, Ser 1.17; Hemoglobin 14.8; Platelets 224; Potassium 4.0; Sodium 139   Recent Lipid Panel Lab Results  Component Value Date/Time   CHOL 130 09/15/2017 09:44 AM   TRIG 69 09/15/2017 09:44 AM   HDL 54 09/15/2017 09:44 AM   CHOLHDL 2.4 09/15/2017 09:44 AM   LDLCALC 62 09/15/2017 09:44 AM    Wt Readings from Last 3 Encounters:  05/14/18 203 lb (92.1 kg)  02/23/18 196 lb 4 oz (89 kg)  01/04/18 200 lb 6.4 oz (90.9 kg)     Exam:    Vital Signs: Vital signs may also be detailed in the HPI There were no vitals taken for this visit.  Wt Readings from Last 3 Encounters:  05/14/18 203 lb (92.1 kg)  02/23/18 196 lb 4 oz (89 kg)  01/04/18 200 lb 6.4 oz (90.9 kg)  Temp Readings from Last 3 Encounters:  05/14/18 98.6 F (37 C) (Oral)  01/04/18 98 F (36.7 C) (Oral)  10/15/17 (!) 96.9 F (36.1 C) (Tympanic)   BP Readings from Last 3 Encounters:  05/14/18 127/85  02/23/18 130/80  01/04/18 116/82   Pulse Readings from Last 3 Encounters:  05/14/18 (!) 46  02/23/18 64  01/04/18 (!) 25     Well nourished, well developed male in no acute distress. Constitutional:  oriented to person, place, and time. No distress.    ASSESSMENT & PLAN:    Dilated cardiomyopathy ([place]) Given smoking history, cardiac risk factors, wall motion abnormality, prior chest pain symptoms concern for unstable angina , cardiomyopathy we have recommended cardiac catheterization I have reviewed the risks, indications, and alternatives to cardiac catheterization, possible angioplasty, and stenting with the patient. Risks include but are not limited to bleeding, infection, vascular injury, stroke, myocardial infection, arrhythmia, kidney injury, radiation-related injury in the case of prolonged fluoroscopy use, emergency cardiac surgery, and death. The patient understands the risks of serious complication is 1-2 in 6767 with diagnostic cardiac cath  and 1-2% or less with angioplasty/stenting.  He has been scheduled for May 21st,  9:30 in the morning  Smoker We have encouraged her to continue to work on weaning her cigarettes and smoking cessation. She will continue to work on this and does not want any assistance with chantix.   Abnormal EKG Given cardiomyopathy, concern for ischemia Catheterization planned as above    COVID-19 Education: The signs and symptoms of COVID-19 were discussed with the patient and how to seek care for testing (follow up with PCP or arrange E-visit).  The importance of social distancing was discussed today.  Patient Risk:   After full review of this patients clinical status, I feel that they are at least moderate risk at this time.  Time:   Today, I have spent 25 minutes with the patient with telehealth technology discussing the cardiac and medical problems/diagnoses detailed above   10 min spent reviewing the chart prior to patient visit today   Medication Adjustments/Labs and Tests Ordered: Current medicines are reviewed at length with the patient today.  Concerns regarding medicines are outlined above.   Tests Ordered: No tests ordered   Medication Changes: No changes made   Disposition: Follow-up in 2 weeks  Signed, Ida Rogue, MD  07/13/2018 1:42 PM    [place] [facility] [address], [place], [place] [address]

## 2018-07-12 NOTE — Telephone Encounter (Signed)
Spoke with patient and reviewed that provider wanted to have a virtual visit with him tomorrow to discuss possible procedure later this week. Reviewed that this will require labs and COVID test in order to get that done. Requested that he please go to [place] entrance of the hospital to have pre procedure labs done and then go to [facility] to have COVID testing done. Advised that I would give him a call back later with more instructions.   Called and spoke with Pamala Hurry and scheduled left heart cath on Thursday 07/15/18 at 09:30 AM with Dr. Rockey Situ.

## 2018-07-13 ENCOUNTER — Telehealth (INDEPENDENT_AMBULATORY_CARE_PROVIDER_SITE_OTHER): Payer: 59 | Admitting: Cardiovascular Disease

## 2018-07-13 DIAGNOSIS — I200 Unstable angina: Secondary | ICD-10-CM

## 2018-07-13 DIAGNOSIS — E782 Mixed hyperlipidemia: Secondary | ICD-10-CM | POA: Diagnosis not present

## 2018-07-13 DIAGNOSIS — F17200 Nicotine dependence, unspecified, uncomplicated: Secondary | ICD-10-CM | POA: Diagnosis not present

## 2018-07-13 DIAGNOSIS — R9431 Abnormal electrocardiogram [ECG] [EKG]: Secondary | ICD-10-CM

## 2018-07-13 DIAGNOSIS — I420 Dilated cardiomyopathy: Secondary | ICD-10-CM | POA: Diagnosis not present

## 2018-07-13 LAB — NOVEL CORONAVIRUS, NAA (HOSP ORDER, SEND-OUT TO REF LAB; TAT 18-24 HRS): SARS-CoV-2, NAA: NOT DETECTED

## 2018-07-13 NOTE — Telephone Encounter (Signed)
Spoke with the pt. Pt sts that he was given pre-cath instructions by Dr.Gollan during today's telemedicine visit. His only question is what time to report to [facility] mall on 07/15/18.  Adv the pt that his procedure is scheduled for 8:30am. He will need to arrive by 6:30am. reviewed pre-lim cbc and bmet results with the pt. Adv him that his COVID-19 testing was still pending. Adv him that once the results are reviewed by Dr.Gollan we will call back if he has any recommendations. Pt has no other questions or concerns at this time. Pt and his wife voiced appreciation for the call.

## 2018-07-13 NOTE — Patient Instructions (Addendum)
Mr. Leger would like to know what time to be at the hospital Thursday morning   Medication Instructions:  No changes to his medications  If you need a refill on your cardiac medications before your next appointment, please call your pharmacy.  I did tell him 8:30, no food that morning or drink    Lab work: Pre-procedure labs and COVID swab done yesterday 07/12/2018  If you have labs (blood work) drawn today and your tests are completely normal, you will receive your results only by: Marland Kitchen MyChart Message (if you have MyChart) OR . A paper copy in the mail If you have any lab test that is abnormal or we need to change your treatment, we will call you to review the results.  Testing/Procedures: [facility] Cardiac Cath Instructions   You are scheduled for a Cardiac Cath on:_Thursday May 21st_  Please arrive at 08:30 am on the day of your procedure  Please expect a call from our [place] to pre-register you  Do not eat/drink anything after midnight  Someone will need to drive you home  It is recommended someone be with you for the first 24 hours after your procedure  Wear clothes that are easy to get on/off and wear slip on shoes if possible   Medications bring a current list of all medications with you  _XX__ You may take all of your medications the morning of your procedure with enough water to swallow safely   Day of your procedure: Arrive at the [place] entrance.  Free valet service is available.  After entering the [place] please check-in at the registration desk (1st desk on your right) to receive your armband. After receiving your armband someone will escort you to the cardiac cath/special procedures waiting area.  The usual length of stay after your procedure is about 2 to 3 hours.  This can vary.  If you have any questions, please call our office at [phone], or you may call the cardiac cath lab at [facility] directly at  [phone]   Follow-Up: At [facility], you and your health needs are our priority.  As part of our continuing mission to provide you with exceptional heart care, we have created designated Provider Care Teams.  These Care Teams include your primary Cardiologist (physician) and Advanced Practice Providers (APPs -  Physician Assistants and Nurse Practitioners) who all work together to provide you with the care you need, when you need it. You will need a follow up appointment in 2 weeks.  You may see Dr. Rockey Situ or one of the following Advanced Practice Providers on your designated Care Team:   Murray Hodgkins, NP Christell Faith, PA-C . Marrianne Mood, PA-C  Any Other Special Instructions Will Be Listed Below (If Applicable).

## 2018-07-14 ENCOUNTER — Other Ambulatory Visit: Payer: Self-pay | Admitting: Cardiovascular Disease

## 2018-07-14 DIAGNOSIS — I200 Unstable angina: Secondary | ICD-10-CM

## 2018-07-15 ENCOUNTER — Telehealth: Payer: Self-pay | Admitting: Cardiovascular Disease

## 2018-07-15 ENCOUNTER — Encounter
Admission: RE | Disposition: A | Discharge status: Home / Self Care | Payer: Self-pay | Source: Ambulatory Visit | Attending: Cardiovascular Disease

## 2018-07-15 ENCOUNTER — Other Ambulatory Visit: Payer: Self-pay

## 2018-07-15 ENCOUNTER — Ambulatory Visit
Admission: RE | Admit: 2018-07-15 | Discharge: 2018-07-15 | Disposition: A | Discharge status: Home / Self Care | Payer: 59 | Source: Ambulatory Visit | Attending: Cardiovascular Disease | Admitting: Cardiovascular Disease

## 2018-07-15 DIAGNOSIS — R0602 Shortness of breath: Secondary | ICD-10-CM | POA: Diagnosis not present

## 2018-07-15 DIAGNOSIS — I200 Unstable angina: Secondary | ICD-10-CM | POA: Diagnosis not present

## 2018-07-15 DIAGNOSIS — Q676 Pectus excavatum: Secondary | ICD-10-CM | POA: Insufficient documentation

## 2018-07-15 DIAGNOSIS — R931 Abnormal findings on diagnostic imaging of heart and coronary circulation: Secondary | ICD-10-CM | POA: Diagnosis not present

## 2018-07-15 DIAGNOSIS — I420 Dilated cardiomyopathy: Secondary | ICD-10-CM | POA: Diagnosis not present

## 2018-07-15 DIAGNOSIS — Z1159 Encounter for screening for other viral diseases: Secondary | ICD-10-CM | POA: Insufficient documentation

## 2018-07-15 DIAGNOSIS — Z79899 Other long term (current) drug therapy: Secondary | ICD-10-CM | POA: Insufficient documentation

## 2018-07-15 DIAGNOSIS — Z7982 Long term (current) use of aspirin: Secondary | ICD-10-CM | POA: Insufficient documentation

## 2018-07-15 DIAGNOSIS — Z87891 Personal history of nicotine dependence: Secondary | ICD-10-CM | POA: Insufficient documentation

## 2018-07-15 HISTORY — PX: LEFT HEART CATH AND CORONARY ANGIOGRAPHY: CATH118249

## 2018-07-15 SURGERY — LEFT HEART CATH AND CORONARY ANGIOGRAPHY
Anesthesia: Moderate Sedation | Laterality: Left

## 2018-07-15 SURGERY — LEFT HEART CATH AND CORONARY ANGIOGRAPHY
Anesthesia: Moderate Sedation | Laterality: Right

## 2018-07-15 MED ORDER — FENTANYL CITRATE (PF) 100 MCG/2ML IJ SOLN
INTRAMUSCULAR | Status: AC
  Filled 2018-07-15: qty 2

## 2018-07-15 MED ORDER — MIDAZOLAM HCL 2 MG/2ML IJ SOLN
INTRAMUSCULAR | Status: AC
  Filled 2018-07-15: qty 2

## 2018-07-15 MED ORDER — ASPIRIN 81 MG PO CHEW
CHEWABLE_TABLET | ORAL | Status: AC
  Filled 2018-07-15: qty 1

## 2018-07-15 MED ORDER — HYDRALAZINE HCL 20 MG/ML IJ SOLN: 10.0000 mg | INTRAMUSCULAR | Status: DC | PRN

## 2018-07-15 MED ORDER — IOHEXOL 300 MG/ML  SOLN
INTRAMUSCULAR | Status: DC | PRN
  Administered 2018-07-15: 95 mL via INTRA_ARTERIAL

## 2018-07-15 MED ORDER — ASPIRIN 81 MG PO CHEW
81.0000 mg | CHEWABLE_TABLET | ORAL | Status: AC
  Administered 2018-07-15: 08:00:00 81 mg via ORAL

## 2018-07-15 MED ORDER — SODIUM CHLORIDE 0.9% FLUSH: 3.0000 mL | Freq: Two times a day (BID) | INTRAVENOUS | Status: DC

## 2018-07-15 MED ORDER — HEPARIN (PORCINE) IN NACL 1000-0.9 UT/500ML-% IV SOLN
INTRAVENOUS | Status: DC | PRN
  Administered 2018-07-15: 500 mL

## 2018-07-15 MED ORDER — SACUBITRIL-VALSARTAN 24-26 MG PO TABS: 1.0000 | ORAL_TABLET | Freq: Two times a day (BID) | ORAL | 11 refills | Status: DC

## 2018-07-15 MED ORDER — SODIUM CHLORIDE 0.9 % IV SOLN: 250.0000 mL | INTRAVENOUS | Status: DC | PRN

## 2018-07-15 MED ORDER — HEPARIN (PORCINE) IN NACL 1000-0.9 UT/500ML-% IV SOLN
INTRAVENOUS | Status: AC
  Filled 2018-07-15: qty 1000

## 2018-07-15 MED ORDER — SODIUM CHLORIDE 0.9 % WEIGHT BASED INFUSION
3.0000 mL/kg/h | INTRAVENOUS | Status: AC
  Administered 2018-07-15: 08:00:00 3 mL/kg/h via INTRAVENOUS

## 2018-07-15 MED ORDER — LABETALOL HCL 5 MG/ML IV SOLN: 10.0000 mg | INTRAVENOUS | Status: DC | PRN

## 2018-07-15 MED ORDER — ACETAMINOPHEN 325 MG PO TABS: 650.0000 mg | ORAL_TABLET | ORAL | Status: DC | PRN

## 2018-07-15 MED ORDER — FENTANYL CITRATE (PF) 100 MCG/2ML IJ SOLN
INTRAMUSCULAR | Status: DC | PRN
  Administered 2018-07-15: 50 ug via INTRAVENOUS

## 2018-07-15 MED ORDER — SODIUM CHLORIDE 0.9% FLUSH: 3.0000 mL | INTRAVENOUS | Status: DC | PRN

## 2018-07-15 MED ORDER — CARVEDILOL 3.125 MG PO TABS: 3.1250 mg | ORAL_TABLET | Freq: Two times a day (BID) | ORAL | 3 refills | Status: DC

## 2018-07-15 MED ORDER — SODIUM CHLORIDE 0.9 % WEIGHT BASED INFUSION: 1.0000 mL/kg/h | INTRAVENOUS | Status: DC

## 2018-07-15 MED ORDER — ONDANSETRON HCL 4 MG/2ML IJ SOLN: 4.0000 mg | Freq: Four times a day (QID) | INTRAMUSCULAR | Status: DC | PRN

## 2018-07-15 MED ORDER — MIDAZOLAM HCL 2 MG/2ML IJ SOLN
INTRAMUSCULAR | Status: DC | PRN
  Administered 2018-07-15: 1 mg via INTRAVENOUS

## 2018-07-15 SURGICAL SUPPLY — 10 items
CATH INFINITI 5FR ANG PIGTAIL (CATHETERS) ×2 IMPLANT
CATH INFINITI 5FR JL4 (CATHETERS) ×2 IMPLANT
CATH INFINITI JR4 5F (CATHETERS) ×2 IMPLANT
DEVICE CLOSURE MYNXGRIP 5F (Vascular Products) ×2 IMPLANT
KIT MANI 3VAL PERCEP (MISCELLANEOUS) ×2 IMPLANT
NEEDLE PERC 18GX7CM (NEEDLE) ×2 IMPLANT
PACK CARDIAC CATH (CUSTOM PROCEDURE TRAY) ×2 IMPLANT
SHEATH AVANTI 5FR X 11CM (SHEATH) ×2 IMPLANT
SYR CONTROL 10ML ANGIOGRAPHIC (SYRINGE) ×2 IMPLANT
WIRE GUIDERIGHT .035X150 (WIRE) ×2 IMPLANT

## 2018-07-15 NOTE — Telephone Encounter (Signed)
Dr. Rockey Situ provided orders through secure chat for this patient based on his procedure done earlier today. He wanted to start patient on carvedilol 3.125 mg twice a day and Entresto 24/26 mg twice a day with coupon card for patient. Will enter these orders and have someone to call tomorrow to review this information with the patient. Prescriptions sent into pharmacy with coupon card attached to [facility] script. Patient may need additional coupon copay card so just let him know to reach out to [place] or look up on computer if possible.

## 2018-07-15 NOTE — H&P (Signed)
H&P Addendum, precardiac catheterization  Patient was seen and evaluated prior to Cardiac catheterization procedure Symptoms, prior testing details again confirmed with the patient Patient examined, no significant change from prior exam Lab work reviewed in detail personally by myself Patient understands risk and benefit of the procedure, willing to proceed  Signed, Tim Gollan, MD, Ph.D [facility]    

## 2018-07-16 ENCOUNTER — Encounter: Payer: Self-pay | Admitting: Cardiovascular Disease

## 2018-07-16 ENCOUNTER — Telehealth: Payer: Self-pay | Admitting: Cardiovascular Disease

## 2018-07-16 NOTE — Telephone Encounter (Signed)
Called patient. He verbalized understanding to start carvedilol and Entresto. Rx's had already been sent in and he had received notification from his pharmacy that they were ready. He wrote down the instructions and read them back. He is always aware of upcoming virtual follow up with Dr Rockey Situ on 07/27/2018.

## 2018-07-16 NOTE — Telephone Encounter (Signed)
Spoke with patient. States he went to the pharmacy and they did not receive the coupon code on the Rx that was already sent in. It was noted on the "Note to pharmacy."  Patient went ahead and picked about Rx and spent about $500 on it. He was quite surprised at the cost. Advised that we are here to help with that. Helped patient pull up Entresto website to fill out application for free [id number] card and also recommended he call them to be screened for patient assistance. I told him I would call [facility] and see what I could find out and how we can get him the 30 days.     Called [facility] and was on hold for about 13 minutes and was not able to speak with anyone. Left message with patient's coupon card number as listed on Rx that was sent in and see if they could honor it.   Called patient back and let him know. Advised him to call insurance company, call [employer] and pharmacy again to see if he could qualify for patient assistance or how much the medication will cost ongoing. Patient was very Patent attorney.

## 2018-07-16 NOTE — Telephone Encounter (Signed)
Patient calling back re coupon code for entresto   Please call.

## 2018-07-26 NOTE — Progress Notes (Signed)
Virtual Visit via Video Note   This visit type was conducted due to national recommendations for restrictions regarding the COVID-19 Pandemic (e.g. social distancing) in an effort to limit this patient's exposure and mitigate transmission in our community.  Due to his co-morbid illnesses, this patient is at least at moderate risk for complications without adequate follow up.  This format is felt to be most appropriate for this patient at this time.  All issues noted in this document were discussed and addressed.  A limited physical exam was performed with this format.  Please refer to the patient's chart for his consent to telehealth for [facility]   I connected with  Daniel Benson on 07/27/18 by a video enabled telemedicine application and verified that I am speaking with the correct person using two identifiers. I discussed the limitations of evaluation and management by telemedicine. The patient expressed understanding and agreed to proceed.   Evaluation Performed:  Follow-up visit  Date:  07/27/2018   ID:  Daniel Benson, DOB 1960-03-31, MRN [id number]  Patient Location:  [place] [address]   Provider location:   [facility], [place] office  PCP:  [name], MD  Cardiologist:  Arvid Right [facility]  Chief Complaint: Follow-up after recent cardiac catheterization   History of Present Illness:    Daniel Benson is a 58 y.o. male who presents via audio/video conferencing for a telehealth visit today.   The patient does not symptoms concerning for COVID-19 infection (fever, chills, cough, or new SHORTNESS OF BREATH).   Patient has a past medical history of pectus excavatum  30 year smoking history, stopped in 2008 ,  Symptoms of shortness of breath,  episodes of left-sided chest pain and jaw pain typically at rest  Echocardiogram showing cardiomyopathy ejection fraction 30 to 35%, Cardiac catheterization May 2020 showing  normal coronary arteries, global hypokinesis ejection fraction 30% Who presents for follow-up after recent cardiac catheterization  Cath 07/15/2018 Normal coronary arteries, Global hypokinesis confirmed, EF 30% appears global   following his catheterization we had recommended he start coreg 3.125 mg po BID entresto 24/26 mg po BID  Had dizzy for a while, now getting use to it Better with food  Echo 07/08/2018   1. The left ventricle has moderate-severely reduced systolic function, with an ejection fraction of 30-35%. The cavity size was moderately dilated. Unable to exclude anterior and anteroseptal wall hypokinesis. Left ventricular diastolic Doppler  parameters are consistent with impaired relaxation.  2. RV function mildly reduced.  3. Left atrial size was mildly dilated.  works at [employer]   Prior CV studies:   The following studies were reviewed today:   Past Medical History:  Diagnosis Date  . Chest pain   . ED (erectile dysfunction)   . Heartburn    OCCASIONAL  . Hx of dysplastic nevus 06/14/2003   multiple sites   . Pectus excavatum   . Precancerous lesion    H/O   Past Surgical History:  Procedure Laterality Date  . COLONOSCOPY WITH PROPOFOL N/A 10/15/2017   Procedure: COLONOSCOPY WITH PROPOFOL;  Surgeon: Jonathon Bellows, MD;  Location: [facility] ENDOSCOPY;  Service: Gastroenterology;  Laterality: N/A;  . HERNIA REPAIR     X 2  . INGUINAL HERNIA REPAIR Left   . LEFT HEART CATH AND CORONARY ANGIOGRAPHY Left 07/15/2018   Procedure: LEFT HEART CATH AND CORONARY ANGIOGRAPHY;  Surgeon: Minna Merritts, MD;  Location: [facility]  INVASIVE CV LAB;  Service: Cardiovascular;  Laterality: Left;     No outpatient medications have been marked as taking for the 07/27/18 encounter (Appointment) with Minna Merritts, MD.    Allergies:   Penicillins   Social History   Tobacco Use  . Smoking status: Former Smoker    Packs/day: 1.00    Years: 30.00    Pack years: 30.00    Types:  Cigarettes    Last attempt to quit: 05/26/2006    Years since quitting: 12.1  . Smokeless tobacco: Never Used  Substance Use Topics  . Alcohol use: Yes    Alcohol/week: 2.0 standard drinks    Types: 2 Cans of beer per week    Comment: OCCASIONAL  . Drug use: No     Current Outpatient Medications on File Prior to Visit  Medication Sig Dispense Refill  . acetaminophen (TYLENOL) 500 MG tablet Take 500-1,000 mg by mouth every 6 (six) hours as needed (for pain.).    Marland Kitchen aspirin EC 81 MG tablet Take 81 mg by mouth every evening.    Marland Kitchen atorvastatin (LIPITOR) 10 MG tablet Take 1 tablet (10 mg total) by mouth daily. (Patient taking differently: Take 10 mg by mouth every evening. ) 90 tablet 3  . Biotin 1000 MCG tablet Take 1,000 mcg by mouth every evening.    . carvedilol (COREG) 3.125 MG tablet Take 1 tablet (3.125 mg total) by mouth 2 (two) times daily. 180 tablet 3  . ibuprofen (ADVIL) 200 MG tablet Take 400 mg by mouth every 8 (eight) hours as needed (for pain.).    Marland Kitchen naproxen sodium (ALEVE) 220 MG tablet Take 220 mg by mouth 2 (two) times daily as needed (pain.).    Marland Kitchen predniSONE (STERAPRED UNI-PAK 21 TAB) 10 MG (21) TBPK tablet 6 day taper; take as directed on package instructions (Patient not taking: Reported on 07/12/2018) 21 tablet 0  . sacubitril-valsartan (ENTRESTO) 24-26 MG Take 1 tablet by mouth 2 (two) times daily. 60 tablet 11  . sildenafil (VIAGRA) 50 MG tablet Take 1 tablet (50 mg total) by mouth daily as needed for erectile dysfunction. 10 tablet 5  . valACYclovir (VALTREX) 1000 MG tablet 2 pills every 12 hours for one day at onset of fever blister/cold sore (Patient taking differently: Take 1,000 mg by mouth 2 (two) times daily as needed (cold sores/fever blisters.). ) 12 tablet 1   No current facility-administered medications on file prior to visit.      Family Hx: The patient's family history includes Dementia in his mother; Diabetes in his mother; Hypertension in his father and  mother; Leukemia in his brother; Pulmonary fibrosis in his father.  ROS:   Please see the history of present illness.    Review of Systems  Constitutional: Negative.   HENT: Negative.   Respiratory: Negative.   Cardiovascular: Negative.   Gastrointestinal: Negative.   Musculoskeletal: Negative.   Neurological: Negative.   Psychiatric/Behavioral: Negative.   All other systems reviewed and are negative.    Labs/Other Tests and Data Reviewed:    Recent Labs: 09/15/2017: ALT 19 01/04/2018: Magnesium 2.1; TSH 1.520 07/12/2018: BUN 22; Creatinine, Ser 1.17; Hemoglobin 14.8; Platelets 224; Potassium 4.0; Sodium 139   Recent Lipid Panel Lab Results  Component Value Date/Time   CHOL 130 09/15/2017 09:44 AM   TRIG 69 09/15/2017 09:44 AM   HDL 54 09/15/2017 09:44 AM   CHOLHDL 2.4 09/15/2017 09:44 AM   LDLCALC 62 09/15/2017 09:44 AM    Wt  Readings from Last 3 Encounters:  07/15/18 193 lb (87.5 kg)  05/14/18 203 lb (92.1 kg)  02/23/18 196 lb 4 oz (89 kg)     Exam:    Vital Signs: Vital signs may also be detailed in the HPI There were no vitals taken for this visit.  Wt Readings from Last 3 Encounters:  07/15/18 193 lb (87.5 kg)  05/14/18 203 lb (92.1 kg)  02/23/18 196 lb 4 oz (89 kg)   Temp Readings from Last 3 Encounters:  07/15/18 (!) 97.5 F (36.4 C) (Oral)  05/14/18 98.6 F (37 C) (Oral)  01/04/18 98 F (36.7 C) (Oral)   BP Readings from Last 3 Encounters:  07/15/18 118/84  05/14/18 127/85  02/23/18 130/80   Pulse Readings from Last 3 Encounters:  07/15/18 (!) 51  05/14/18 (!) 46  02/23/18 64     Well nourished, well developed male in no acute distress. Constitutional:  oriented to person, place, and time. No distress.    ASSESSMENT & PLAN:    Dilated cardiomyopathy (HCC) Nonischemic, idiopathic Denies alcohol, or sleep apnea EF 30%, on prior studies Tolerating low-dose Entresto and  Coreg but getting better taking the medications with food Very  expensive Entresto, unclear why given he has a coupon we will look into this for him We will not advance his medications given he is having some dizziness He will buy blood pressure cuff and check at home Including orthostatic numbers -Follow-up echocardiogram late this summer once medications have been maximized  Smoker Quit >10 years ago   COVID-19 Education: The signs and symptoms of COVID-19 were discussed with the patient and how to seek care for testing (follow up with PCP or arrange E-visit).  The importance of social distancing was discussed today.  Patient Risk:   After full review of this patients clinical status, I feel that they are at least moderate risk at this time.  Time:   Today, I have spent 25 minutes with the patient with telehealth technology discussing the cardiac and medical problems/diagnoses detailed above   10 min spent reviewing the chart prior to patient visit today   Medication Adjustments/Labs and Tests Ordered: Current medicines are reviewed at length with the patient today.  Concerns regarding medicines are outlined above.   Tests Ordered: No tests ordered   Medication Changes: No changes made   Disposition: Follow-up in 2 month  Signed, Ida Rogue, MD  07/27/2018 8:28 AM    [place] [facility] [address], [place], [place] [address]

## 2018-07-27 ENCOUNTER — Telehealth: Payer: Self-pay | Admitting: Cardiovascular Disease

## 2018-07-27 ENCOUNTER — Telehealth (INDEPENDENT_AMBULATORY_CARE_PROVIDER_SITE_OTHER): Payer: 59 | Admitting: Cardiovascular Disease

## 2018-07-27 ENCOUNTER — Other Ambulatory Visit: Payer: Self-pay

## 2018-07-27 DIAGNOSIS — I420 Dilated cardiomyopathy: Secondary | ICD-10-CM | POA: Diagnosis not present

## 2018-07-27 DIAGNOSIS — F17200 Nicotine dependence, unspecified, uncomplicated: Secondary | ICD-10-CM | POA: Diagnosis not present

## 2018-07-27 DIAGNOSIS — E782 Mixed hyperlipidemia: Secondary | ICD-10-CM

## 2018-07-27 DIAGNOSIS — I493 Ventricular premature depolarization: Secondary | ICD-10-CM

## 2018-07-27 NOTE — Patient Instructions (Addendum)
Medication Instructions:  No changes  Needs help with entresto Even with coupon still costing him $350  Cost of Zio monitor Has lots of PVCs could be contributing to cardiomyopathy He has to pay 20%  Please call [phone], option 1, then option 4 for billing. He will need insurance ID card.  Mention financial assistance pre-qualification if unhappy with the amount they quote which is just two questions, family size and income.    If you need a refill on your cardiac medications before your next appointment, please call your pharmacy.    Lab work: No new labs needed   If you have labs (blood work) drawn today and your tests are completely normal, you will receive your results only by: Marland Kitchen MyChart Message (if you have MyChart) OR . A paper copy in the mail If you have any lab test that is abnormal or we need to change your treatment, we will call you to review the results.   Testing/Procedures: This is the monitor he would like you to wear and number listed above for quote on price. Please call back if you would like [place] to order and send you the monitor.   Your physician has recommended that you wear a Zio monitor. This monitor is a medical device that records the heart's electrical activity. Doctors most often use these monitors to diagnose arrhythmias. Arrhythmias are problems with the speed or rhythm of the heartbeat. The monitor is a small device applied to your chest. You can wear one while you do your normal daily activities. While wearing this monitor if you have any symptoms to push the button and record what you felt. Once you have worn this monitor for the period of time provider prescribed (Usually 14 days), you will return the monitor device in the postage paid box. Once it is returned they will download the data collected and provide [place] with a report which the provider will then review and we will call you with those results. Important tips:  1. Avoid showering during the  first 24 hours of wearing the monitor. 2. Avoid excessive sweating to help maximize wear time. 3. Do not submerge the device, no hot tubs, and no swimming pools. 4. Keep any lotions or oils away from the patch. 5. After 24 hours you may shower with the patch on. Take brief showers with your back facing the shower head.  6. Do not remove patch once it has been placed because that will interrupt data and decrease adhesive wear time. 7. Push the button when you have any symptoms and write down what you were feeling. 8. Once you have completed wearing your monitor, remove and place into box which has postage paid and place in your outgoing mailbox.  9. If for some reason you have misplaced your box then call our office and we can provide another box and/or mail it off for you.         Follow-Up: At [facility], you and your health needs are our priority.  As part of our continuing mission to provide you with exceptional heart care, we have created designated Provider Care Teams.  These Care Teams include your primary Cardiologist (physician) and Advanced Practice Providers (APPs -  Physician Assistants and Nurse Practitioners) who all work together to provide you with the care you need, when you need it.  . You will need a follow up appointment in 2 months .   Please call our office 2 months in advance to schedule  this appointment.    . Providers on your designated Care Team:   . Murray Hodgkins, NP . Christell Faith, PA-C . Marrianne Mood, PA-C  Any Other Special Instructions Will Be Listed Below (If Applicable).  For educational health videos Log in to : [email or web address] Or : [email or web address], password : [email or web address]

## 2018-07-27 NOTE — Telephone Encounter (Signed)
Spoke with [facility] N [facility] , Delene Loll, and patient. Coupon card codes for free 30 day supply given to [facility] and they advised patient will need to bring his receipt back for refund. Provided them with activated co pay card codes for $10 co pay and they entered that information as well. This will allow them to split bill with his insurance to decrease the amount he pays. Providing cards to patient for him to follow up with [facility]. He verbalized understanding with no further questions at this time.

## 2018-07-27 NOTE — Telephone Encounter (Signed)
Spoke with patient and provided him with number to call regarding cost of a zio monitor along with information to ask and to have insurance card handy when he calls. Instructed that if he decides to try one to please call our office and we can order monitor to be shipped to him.   Spoke with him about entresto copay card and reviewed that I would need to activate it for him and then call pharmacy in order to get this fixed. He was agreeable with me completing any information needed and advised that I would let him know the outcome.

## 2018-08-04 NOTE — Telephone Encounter (Signed)
Spoke with patient and apologized for the late call. Just wanted to follow up and see if he was able to get refund from pharmacy and he confirmed that he received full refund of $530.00 and has discount card for lower copay moving forward. Advised that he should please give [place] a call if he should have any further questions or concerns. He was appreciative for the call and follow up on this and was thankful for our help in getting this done.

## 2018-08-16 ENCOUNTER — Other Ambulatory Visit: Payer: Self-pay | Admitting: Family Medicine

## 2018-08-16 DIAGNOSIS — E782 Mixed hyperlipidemia: Secondary | ICD-10-CM

## 2018-08-16 NOTE — Telephone Encounter (Signed)
[facility] faxed refill request for the following medications:  atorvastatin (LIPITOR) 10 MG tablet   90 day supply Please advise. Thanks TNP

## 2018-08-17 MED ORDER — ATORVASTATIN CALCIUM 10 MG PO TABS: 10.0000 mg | ORAL_TABLET | Freq: Every day | ORAL | 1 refills | Status: DC

## 2018-08-17 NOTE — Telephone Encounter (Signed)
Please review. Former Engineer, agricultural patient. Thanks!

## 2018-09-19 NOTE — Progress Notes (Signed)
Cardiology Office Note  Date:  09/20/2018   ID:  ROI JAFARI, DOB 04/16/1960, MRN [id number]  PCP:  [name], MD   Chief Complaint  Patient presents with  . Other    2 month follow up. Meds reviewed verbally with patient.     HPI:  Mr. Pellot is a very pleasant 58 year old gentleman with pectus excavatum  30 year smoking history, stopped in 2008 ,  previously with shortness of breath, improved since he stopped smoking,   episodes of left-sided chest pain and jaw pain typically at rest Echocardiogram showing cardiomyopathy ejection fraction 30 to 35%, Cardiac catheterization May 2020 showing normal coronary arteries, global hypokinesis ejection fraction 30% Who presents for follow-up after recent cardiac catheterization  Dilated cardiomyopathy (HCC) Nonischemic, idiopathic Denies alcohol, or sleep apnea EF 30%, on prior studies Tolerating low-dose Entresto and  Coreg   having some dizziness on last visit, no med changes made  still with rare dizziness with standing, only at home, not at work Happens in the morning in the evening Home numbers sometimes low,  Gets dehydrated frequently Interested in decreasing some of his medications  Orthostatics here supine 117/71, pulse 58 Sitting: 115/71, pulse 63 Standing: 127/71 pulse 74 3 min 120/73, pulse 72  Works for [employer], in hot weather  EKG personally reviewed by myself on todays visit  shows NSR with rate 55 bpm nonspecific T wave abnormality  Other past medical history reviewed Cath 07/15/2018 Normal coronary arteries, Global hypokinesis confirmed, EF 30% appears global   following his catheterization we had recommended he start coreg 3.125 mg po BID entresto 24/26 mg po BID  Echo 07/08/2018  1. The left ventricle has moderate-severely reduced systolic function, with an ejection fraction of 30-35%. The cavity size was moderately dilated. Unable to exclude anterior and anteroseptal wall  hypokinesis. Left ventricular diastolic Doppler  parameters are consistent with impaired relaxation. 2. RV function mildly reduced. 3. Left atrial size was mildly dilated.   PMH:   has a past medical history of Chest pain, ED (erectile dysfunction), Heartburn, dysplastic nevus (06/14/2003), Pectus excavatum, and Precancerous lesion.  PSH:    Past Surgical History:  Procedure Laterality Date  . COLONOSCOPY WITH PROPOFOL N/A 10/15/2017   Procedure: COLONOSCOPY WITH PROPOFOL;  Surgeon: Jonathon Bellows, MD;  Location: [facility] ENDOSCOPY;  Service: Gastroenterology;  Laterality: N/A;  . HERNIA REPAIR     X 2  . INGUINAL HERNIA REPAIR Left   . LEFT HEART CATH AND CORONARY ANGIOGRAPHY Left 07/15/2018   Procedure: LEFT HEART CATH AND CORONARY ANGIOGRAPHY;  Surgeon: Minna Merritts, MD;  Location: [facility] CV LAB;  Service: Cardiovascular;  Laterality: Left;    Current Outpatient Medications  Medication Sig Dispense Refill  . acetaminophen (TYLENOL) 500 MG tablet Take 500-1,000 mg by mouth every 6 (six) hours as needed (for pain.).    Marland Kitchen aspirin EC 81 MG tablet Take 81 mg by mouth every evening.    Marland Kitchen atorvastatin (LIPITOR) 10 MG tablet Take 1 tablet (10 mg total) by mouth daily. 90 tablet 1  . Biotin 1000 MCG tablet Take 1,000 mcg by mouth every evening.    . carvedilol (COREG) 3.125 MG tablet Take 1 tablet (3.125 mg total) by mouth 2 (two) times daily. 180 tablet 3  . ibuprofen (ADVIL) 200 MG tablet Take 400 mg by mouth every 8 (eight) hours as needed (for pain.).    Marland Kitchen naproxen sodium (ALEVE) 220 MG tablet Take 220 mg by mouth 2 (two)  times daily as needed (pain.).    Marland Kitchen sacubitril-valsartan (ENTRESTO) 24-26 MG Take 1 tablet by mouth 2 (two) times daily. 60 tablet 11  . sildenafil (VIAGRA) 50 MG tablet Take 1 tablet (50 mg total) by mouth daily as needed for erectile dysfunction. 10 tablet 5  . valACYclovir (VALTREX) 1000 MG tablet 2 pills every 12 hours for one day at onset of fever  blister/cold sore (Patient taking differently: Take 1,000 mg by mouth 2 (two) times daily as needed (cold sores/fever blisters.). ) 12 tablet 1   No current facility-administered medications for this visit.      Allergies:   Penicillins   Social History:  The patient  reports that he quit smoking about 12 years ago. His smoking use included cigarettes. He has a 30.00 pack-year smoking history. He has never used smokeless tobacco. He reports current alcohol use of about 2.0 standard drinks of alcohol per week. He reports that he does not use drugs.   Family History:   family history includes Dementia in his mother; Diabetes in his mother; Hypertension in his father and mother; Leukemia in his brother; Pulmonary fibrosis in his father.    Review of Systems: Review of Systems  Constitutional: Negative.   Respiratory: Negative.   Cardiovascular: Negative.   Gastrointestinal: Negative.   Musculoskeletal: Negative.   Neurological: Positive for dizziness.  Psychiatric/Behavioral: Negative.   All other systems reviewed and are negative.   PHYSICAL EXAM: VS:  BP 128/80 (BP Location: Left Arm, Patient Position: Sitting, Cuff Size: Normal)   Pulse (!) 55   Ht 5\' 11"  (1.803 m)   Wt 196 lb (88.9 kg)   BMI 27.34 kg/m  , BMI Body mass index is 27.34 kg/m. Constitutional:  oriented to person, place, and time. No distress.  HENT:  Head: Grossly normal Eyes:  no discharge. No scleral icterus.  Neck: No JVD, no carotid bruits  Cardiovascular: Regular rate and rhythm, no murmurs appreciated Pulmonary/Chest: Clear to auscultation bilaterally, no wheezes or rails Abdominal: Soft.  no distension.  no tenderness.  Musculoskeletal: Normal range of motion Neurological:  normal muscle tone. Coordination normal. No atrophy Skin: Skin warm and dry Psychiatric: normal affect, pleasant   Recent Labs: 01/04/2018: Magnesium 2.1; TSH 1.520 07/12/2018: BUN 22; Creatinine, Ser 1.17; Hemoglobin 14.8;  Platelets 224; Potassium 4.0; Sodium 139    Lipid Panel Lab Results  Component Value Date   CHOL 130 09/15/2017   HDL 54 09/15/2017   LDLCALC 62 09/15/2017   TRIG 69 09/15/2017      Wt Readings from Last 3 Encounters:  09/20/18 196 lb (88.9 kg)  07/15/18 193 lb (87.5 kg)  05/14/18 203 lb (92.1 kg)      ASSESSMENT AND PLAN:  Frequent PVCs Relatively asymptomatic On coreg  Pectus excavatum Has been relatively asymptomatic Likely unrelated to ectopy above  Dilated cardiomyopathy On extresto, coreg twice a day but we will decrease this down to once a day given his dizziness, hold the evening dose No other pills added given dizziness and low blood pressures He has declining echocardiogram at this time, might choose to do this later Once dizziness resolves we did discuss possibly adding spironolactone  Mixed hyperlipidemia -  At goal, no changes to the medications  Disposition:   F/U 6 months    Total encounter time more than 25 minutes  Greater than 50% was spent in counseling and coordination of care with the patient    Orders Placed This Encounter  Procedures  .  EKG 12-Lead     Signed, Esmond Plants, M.D., Ph.D. 09/20/2018  [place], [place]

## 2018-09-20 ENCOUNTER — Other Ambulatory Visit: Payer: Self-pay

## 2018-09-20 ENCOUNTER — Ambulatory Visit (INDEPENDENT_AMBULATORY_CARE_PROVIDER_SITE_OTHER): Payer: 59 | Admitting: Cardiovascular Disease

## 2018-09-20 ENCOUNTER — Encounter: Payer: Self-pay | Admitting: Cardiovascular Disease

## 2018-09-20 VITALS — BP 128/80 | HR 55 | Ht 71.0 in | Wt 196.0 lb

## 2018-09-20 DIAGNOSIS — E782 Mixed hyperlipidemia: Secondary | ICD-10-CM

## 2018-09-20 DIAGNOSIS — I493 Ventricular premature depolarization: Secondary | ICD-10-CM

## 2018-09-20 DIAGNOSIS — R002 Palpitations: Secondary | ICD-10-CM | POA: Diagnosis not present

## 2018-09-20 DIAGNOSIS — I420 Dilated cardiomyopathy: Secondary | ICD-10-CM | POA: Diagnosis not present

## 2018-09-20 DIAGNOSIS — F17200 Nicotine dependence, unspecified, uncomplicated: Secondary | ICD-10-CM

## 2018-09-20 NOTE — Patient Instructions (Addendum)
Medication Instructions:  Hold the evening coreg, Stay on the morning Please call once dizziness resolves  If you need a refill on your cardiac medications before your next appointment, please call your pharmacy.    Lab work No new labs needed   If you have labs (blood work) drawn today and your tests are completely normal, you will receive your results only by: Marland Kitchen MyChart Message (if you have MyChart) OR . A paper copy in the mail If you have any lab test that is abnormal or we need to change your treatment, we will call you to review the results.   Testing/Procedures: No new testing needed   Follow-Up: At [facility], you and your health needs are our priority.  As part of our continuing mission to provide you with exceptional heart care, we have created designated Provider Care Teams.  These Care Teams include your primary Cardiologist (physician) and Advanced Practice Providers (APPs -  Physician Assistants and Nurse Practitioners) who all work together to provide you with the care you need, when you need it.  . You will need a follow up appointment in 6 months .   Please call our office 2 months in advance to schedule this appointment.    . Providers on your designated Care Team:   . Murray Hodgkins, NP . Christell Faith, PA-C . Marrianne Mood, PA-C  Any Other Special Instructions Will Be Listed Below (If Applicable).  For educational health videos Log in to : [email or web address] Or : [email or web address], password : [email or web address]

## 2018-10-11 ENCOUNTER — Other Ambulatory Visit: Payer: Self-pay | Admitting: Family Medicine

## 2018-10-11 DIAGNOSIS — E782 Mixed hyperlipidemia: Secondary | ICD-10-CM

## 2018-10-11 MED ORDER — ATORVASTATIN CALCIUM 10 MG PO TABS: 10.0000 mg | ORAL_TABLET | Freq: Every day | ORAL | 0 refills | Status: DC

## 2018-10-11 NOTE — Telephone Encounter (Signed)
Please review. Thanks!  

## 2018-10-11 NOTE — Telephone Encounter (Signed)
[facility]  faxed refill request for the following medications:  atorvastatin (LIPITOR) 10 MG tablet    Please advise.

## 2018-11-02 ENCOUNTER — Telehealth: Payer: Self-pay | Admitting: Cardiovascular Disease

## 2018-11-02 DIAGNOSIS — I420 Dilated cardiomyopathy: Secondary | ICD-10-CM

## 2018-11-02 NOTE — Telephone Encounter (Signed)
Please call patient regarding follow up appointment with Dr. Rockey Situ . He denies that he needs 6 months.

## 2018-11-02 NOTE — Telephone Encounter (Signed)
Patient said when he saw Dr Rockey Situ at the end of July, Dr Rockey Situ instructed patient to call back to see about getting pictures of his heart. Dr Rockey Situ wants to see if his Delene Loll is helping his heart. Advised I will confirm if this is ok with Dr Rockey Situ and then reach out to him to schedule.

## 2018-11-04 NOTE — Telephone Encounter (Signed)
If not dizzy, we could add spironolactone 25 mg daily This can help cardiuac function. We can order limited echo for EF Hx of dilated cardiomyopathy

## 2018-11-05 NOTE — Telephone Encounter (Signed)
Attempted to call patient. Daniel Benson 11/05/2018

## 2018-11-09 NOTE — Telephone Encounter (Signed)
Spoke with patient and reviewed provider recommendations. He wants to do the limited echocardiogram first and then determine if he needs additional medication. He would like to have the limited echo done later part of October so advised that I would send to scheduling and they would reach out to him to get this scheduled. He verbalized understanding with no further questions at this time.

## 2018-12-15 ENCOUNTER — Other Ambulatory Visit: Payer: Self-pay

## 2018-12-15 ENCOUNTER — Ambulatory Visit (INDEPENDENT_AMBULATORY_CARE_PROVIDER_SITE_OTHER): Payer: 59

## 2018-12-15 DIAGNOSIS — I420 Dilated cardiomyopathy: Secondary | ICD-10-CM | POA: Diagnosis not present

## 2018-12-15 MED ORDER — PERFLUTREN LIPID MICROSPHERE
1.0000 mL | INTRAVENOUS | Status: AC | PRN
  Administered 2018-12-15: 2 mL via INTRAVENOUS

## 2018-12-22 ENCOUNTER — Telehealth: Payer: Self-pay | Admitting: Cardiovascular Disease

## 2018-12-22 DIAGNOSIS — Z79899 Other long term (current) drug therapy: Secondary | ICD-10-CM

## 2018-12-22 DIAGNOSIS — I420 Dilated cardiomyopathy: Secondary | ICD-10-CM

## 2018-12-22 NOTE — Telephone Encounter (Signed)
Attempted to call the patient. No answer- I left a message to please call back.  

## 2018-12-22 NOTE — Telephone Encounter (Signed)
Notes recorded by Minna Merritts, MD on 12/18/2018 at 10:52 AM EDT  Echo shows EF still depressed 25 to 30, as seen previously  Would avoid ETOH which can depressed cardiac function  Consider adding spironolactone 25 daily

## 2018-12-23 MED ORDER — SPIRONOLACTONE 25 MG PO TABS: 25.0000 mg | ORAL_TABLET | Freq: Every day | ORAL | 6 refills | Status: DC

## 2018-12-23 NOTE — Telephone Encounter (Signed)
Patient is returning your call.  

## 2018-12-23 NOTE — Telephone Encounter (Signed)
I spoke with the patient regarding his echo results that were reviewed by Dr. Rockey Situ. He is aware of MD recommendations to:  1) Start spironolactone 25 mg- take 1 tablet once daily  I have also advised him he will need a repeat BMP in ~ 7-10 days post initiation of spironolactone. He is aware to go the [place] [facility] to have this done on a walk-in basis.  The patient was in for a recall for January with Dr. Rockey Situ. I have scheduled his appointment for 03/21/19 to see Dr. Rockey Situ back in the office.  The patient voices understanding of the above recommendations and is agreeable.

## 2018-12-25 ENCOUNTER — Telehealth: Payer: Self-pay | Admitting: Student
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
## 2018-12-27 ENCOUNTER — Telehealth: Payer: Self-pay | Admitting: Cardiovascular Disease

## 2018-12-27 NOTE — Telephone Encounter (Signed)
Spoke with patient and he states that he felt dizzy the next day after taking the new medication Spirnolactone. He states that this dizziness was so bad that he was in bad shape. He states that he has only took 1 pill Friday morning and since then he has the dizziness and light headedness pretty bad and did not take any more doses of the spironolactone. Let him know that his continued dizziness with just that one dose should not cause this. He said that he did have nausea and was diaphoretic as well with that episode. He has not taken medication but that one time and continues to have issues. Advised that I would send over to provider for review and recommendations and would be in touch. Told him to also speak with his PCP about this since the symptoms have continued without further medication. He verbalized understanding and will reach out to them for appointment. Will send to Dr. Rockey Situ for review and recommendations as well. He was appreciative for the call back with no further questions at this time.

## 2018-12-27 NOTE — Telephone Encounter (Signed)
Pt c/o medication issue:  1. Name of Medication: spironolactone   2. How are you currently taking this medication (dosage and times per day)?  25 mg po q d   3. Are you having a reaction (difficulty breathing--STAT)? Dizzy swimmy headed   4. What is your medication issue? Patient recently started taking this med and states this has never happened before    Please call to discuss. Patient states he called earlier but has not heard anything back yet .  He says the weekend on call told him to hold dose.

## 2018-12-27 NOTE — Telephone Encounter (Signed)
Pt c/o medication issue:  1. Name of Medication: spironolactone   2. How are you currently taking this medication (dosage and times per day)? 25 MG 1 tablet daily   3. Are you having a reaction (difficulty breathing--STAT)? Extremely dizzy  4. What is your medication issue? Patient just recently started taking medication, would wake up extremely dizzy and he would have to lay down for a couple hours for it to go away.  He took just a half pill and Sunday morning  the same thing happened.  He stopped taking medication completely and this morning things seemed a little better.  Patient requested an appt today with Dr Rockey Situ, nothing available, declined visit with APP tomorrow morning.  Would like to speak with nurse.

## 2018-12-28 ENCOUNTER — Encounter: Payer: Self-pay | Admitting: Family Medicine

## 2018-12-28 ENCOUNTER — Ambulatory Visit (INDEPENDENT_AMBULATORY_CARE_PROVIDER_SITE_OTHER): Payer: 59 | Admitting: Family Medicine

## 2018-12-28 ENCOUNTER — Other Ambulatory Visit: Payer: Self-pay

## 2018-12-28 VITALS — BP 128/72 | HR 42 | Temp 96.6°F | Wt 198.0 lb

## 2018-12-28 DIAGNOSIS — I420 Dilated cardiomyopathy: Secondary | ICD-10-CM | POA: Diagnosis not present

## 2018-12-28 DIAGNOSIS — Z23 Encounter for immunization: Secondary | ICD-10-CM

## 2018-12-28 DIAGNOSIS — R42 Dizziness and giddiness: Secondary | ICD-10-CM | POA: Diagnosis not present

## 2018-12-28 DIAGNOSIS — R001 Bradycardia, unspecified: Secondary | ICD-10-CM | POA: Diagnosis not present

## 2018-12-28 NOTE — Telephone Encounter (Signed)
See other telephone encounter regarding this medication. Sent to Dr. Rockey Situ for his review.

## 2018-12-28 NOTE — Progress Notes (Signed)
Daniel Benson  MRN: [id number] DOB: [birth year]  Subjective:  HPI   The patient is a 58 year old male who presents for evaluation of dizziness/vertigo.  He states that he was started on Spironolactone on Friday by his cardiologist.  He reports that on Saturday when he woke up he experienced dizziness and the feeling that he was going to fall.   The patient has been being seen by cardiology for the last few months.  He was evaluated to see if the deformity of his sternum was having any effect on his heart.  After having some testing done the patient states he was told his cardiac function was about 35%.  He was placed on Entresto and Carvedilol at that time.  Last week he had a second Echo which revealed the cardiac function still low at 25-30.  He was then placed on Spironolactone.    Patient Active Problem List   Diagnosis Date Noted  . Unstable angina ([place]) 07/14/2018  . Dilated cardiomyopathy ([place]) 07/12/2018  . 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 Palpitations 02/22/2018  . HLD (hyperlipidemia) 07/12/2012  . Abnormal EKG 05/26/2011  . Pectus excavatum 05/26/2011    Past Medical History:  Diagnosis Date  . Chest pain   . ED (erectile dysfunction)   . Heartburn    OCCASIONAL  . Hx of dysplastic nevus 06/14/2003   multiple sites   . Pectus excavatum   . Precancerous lesion    H/O   Past Surgical History:  Procedure Laterality Date  . COLONOSCOPY WITH PROPOFOL N/A 10/15/2017   Procedure: COLONOSCOPY WITH PROPOFOL;  Surgeon: Jonathon Bellows, MD;  Location: [facility] ENDOSCOPY;  Service: Gastroenterology;  Laterality: N/A;  . HERNIA REPAIR     X 2  . INGUINAL HERNIA REPAIR Left   . LEFT HEART CATH AND CORONARY ANGIOGRAPHY Left 07/15/2018   Procedure: LEFT HEART CATH AND CORONARY ANGIOGRAPHY;  Surgeon: Minna Merritts, MD;  Location: [place] CV LAB;  Service: Cardiovascular;  Laterality: Left;   Family History  Problem Relation Age of Onset  . Hypertension Mother   . Diabetes  Mother   . Dementia Mother   . Hypertension Father   . Pulmonary fibrosis Father   . Leukemia Brother    Social History   Socioeconomic History  . Marital status: Married    Spouse name: Not on file  . Number of children: Not on file  . Years of education: Not on file  . Highest education level: Not on file  Occupational History  . Occupation: Quarry manager: [employer]  Social Needs  . Financial resource strain: Not on file  . Food insecurity    Worry: Not on file    Inability: Not on file  . Transportation needs    Medical: Not on file    Non-medical: Not on file  Tobacco Use  . Smoking status: Former Smoker    Packs/day: 1.00    Years: 30.00    Pack years: 30.00    Types: Cigarettes    Quit date: 05/26/2006    Years since quitting: 12.6  . Smokeless tobacco: Never Used  Substance and Sexual Activity  . Alcohol use: Yes    Alcohol/week: 2.0 standard drinks    Types: 2 Cans of beer per week    Comment: OCCASIONAL  . Drug use: No  . Sexual activity: Yes  Lifestyle  . Physical activity    Days per week: Not on file    Minutes  per session: Not on file  . Stress: Not on file  Relationships  . Social Herbalist on phone: Not on file    Gets together: Not on file    Attends religious service: Not on file    Active member of club or organization: Not on file    Attends meetings of clubs or organizations: Not on file    Relationship status: Not on file  . Intimate partner violence    Fear of current or ex partner: Not on file    Emotionally abused: Not on file    Physically abused: Not on file    Forced sexual activity: Not on file  Other Topics Concern  . Not on file  Social History Narrative  . Not on file    Outpatient Encounter Medications as of 12/28/2018  Medication Sig  . acetaminophen (TYLENOL) 500 MG tablet Take 500-1,000 mg by mouth every 6 (six) hours as needed (for pain.).  Marland Kitchen aspirin EC 81 MG tablet Take 81 mg by mouth every  evening.  Marland Kitchen atorvastatin (LIPITOR) 10 MG tablet Take 1 tablet (10 mg total) by mouth daily.  . Biotin 1000 MCG tablet Take 1,000 mcg by mouth every evening.  Marland Kitchen ibuprofen (ADVIL) 200 MG tablet Take 400 mg by mouth every 8 (eight) hours as needed (for pain.).  Marland Kitchen naproxen sodium (ALEVE) 220 MG tablet Take 220 mg by mouth 2 (two) times daily as needed (pain.).  Marland Kitchen sacubitril-valsartan (ENTRESTO) 24-26 MG Take 1 tablet by mouth 2 (two) times daily.  . sildenafil (VIAGRA) 50 MG tablet Take 1 tablet (50 mg total) by mouth daily as needed for erectile dysfunction.  Marland Kitchen spironolactone (ALDACTONE) 25 MG tablet Take 1 tablet (25 mg total) by mouth daily.  . valACYclovir (VALTREX) 1000 MG tablet 2 pills every 12 hours for one day at onset of fever blister/cold sore (Patient taking differently: Take 1,000 mg by mouth 2 (two) times daily as needed (cold sores/fever blisters.). )  . carvedilol (COREG) 3.125 MG tablet Take 1 tablet (3.125 mg total) by mouth 2 (two) times daily.   No facility-administered encounter medications on file as of 12/28/2018.    Allergies  Allergen Reactions  . Penicillins Diarrhea    Stomach upset Did it involve swelling of the face/tongue/throat, SOB, or low BP? No Did it involve sudden or severe rash/hives, skin peeling, or any reaction on the inside of your mouth or nose? No Did you need to seek medical attention at a hospital or doctor's office? No When did it last happen? More than 20 years ago If all above answers are "NO", may proceed with cephalosporin use.    Review of Systems  Constitutional: Negative for chills, diaphoresis, fever and malaise/fatigue.  HENT: Negative for congestion, ear pain, sinus pain and sore throat.   Respiratory: Negative for cough and shortness of breath.   Cardiovascular: Negative for chest pain.  Gastrointestinal: Negative for abdominal pain and diarrhea.  Musculoskeletal: Negative for myalgias.  Neurological: Positive for dizziness. Negative  for headaches.    Objective:  BP 128/72 (BP Location: Right Arm, Patient Position: Sitting, Cuff Size: Normal)   Pulse (!) 42   Temp (!) 96.6 F (35.9 C) (Skin)   Wt 198 lb (89.8 kg)   SpO2 98%   BMI 27.62 kg/m   Physical Exam  Constitutional: He is oriented to person, place, and time and well-developed, well-nourished, and in no distress.  HENT:  Head: Normocephalic.  Eyes: Conjunctivae are normal.  Neck: Neck supple.  Cardiovascular:  No murmur. Bradycardic with PVC's. No carotid bruits or edema noticed.  Pulmonary/Chest: Effort normal and breath sounds normal.  Abdominal: Soft.  Musculoskeletal: Normal range of motion.  Neurological: He is alert and oriented to person, place, and time.  Skin: No rash noted.  Psychiatric: Mood, affect and judgment normal.    Assessment and Plan :  1. Dizziness Onset 12-24-18 after taking Spironolactone for the first time. Lasted 2-3 hours the first episode. Since he stopped taking it, episodes are shorter and each time. Has been able to work in his yard and wash his house over the weekend. No chest pain or peripheral edema with his cardiomyopathy. EKG today shows some PVC's with slow heart rate. No chest pains, dyspnea or peripheral edema. No hearing loss, tinnitus, respiratory symptoms of congestion. No fever or headache. Will check CBC and CMP for metabolic disorder or signs of infection. Suspect secondary to trial to new medications with cardiomyopathy worsening. Will make these studies available for cardiology (Dr. Rockey Situ) review. If dizziness returns and persists, will need to go to ER. - CBC with Differential/Platelet - Comprehensive metabolic panel  2. Bradycardia Heart rate slow with PVC's (nearly trigeminal and occasionally 2 foci) today. Will check routine labs to assess hydration and electrolytes. - EKG 12-Lead - CBC with Differential/Platelet - Comprehensive metabolic panel  3. Dilated cardiomyopathy (HCC) Echocardiogram showed  left ventricle hypokinesis with EF of 25-30%. Presently on Carvedilol 3.125 mg qd and Entresto 24-26 mg BID. Will continue to hold the Spironolactone until lab reports return and he consults with Dr. Rockey Situ (cardiologist). - CBC with Differential/Platelet - Comprehensive metabolic panel  4. Needs flu shot - Flu Vaccine QUAD 6+ mos PF IM )Fluarix Quad PF)

## 2018-12-29 LAB — CBC WITH DIFFERENTIAL/PLATELET
Basophils Absolute: 0.1 10*3/uL (ref 0.0–0.2)
Basos: 1 %
EOS (ABSOLUTE): 0.1 10*3/uL (ref 0.0–0.4)
Eos: 1 %
Hematocrit: 48.3 % (ref 37.5–51.0)
Hemoglobin: 16.1 g/dL (ref 13.0–17.7)
Immature Grans (Abs): 0 10*3/uL (ref 0.0–0.1)
Immature Granulocytes: 0 %
Lymphocytes Absolute: 2.4 10*3/uL (ref 0.7–3.1)
Lymphs: 30 %
MCH: 31.1 pg (ref 26.6–33.0)
MCHC: 33.3 g/dL (ref 31.5–35.7)
MCV: 93 fL (ref 79–97)
Monocytes Absolute: 0.6 10*3/uL (ref 0.1–0.9)
Monocytes: 7 %
Neutrophils Absolute: 4.8 10*3/uL (ref 1.4–7.0)
Neutrophils: 61 %
Platelets: 276 10*3/uL (ref 150–450)
RBC: 5.17 x10E6/uL (ref 4.14–5.80)
RDW: 12.2 % (ref 11.6–15.4)
WBC: 7.9 10*3/uL (ref 3.4–10.8)

## 2018-12-29 LAB — COMPREHENSIVE METABOLIC PANEL
ALT: 23 IU/L (ref 0–44)
AST: 19 IU/L (ref 0–40)
Albumin/Globulin Ratio: 1.9 (ref 1.2–2.2)
Albumin: 4.5 g/dL (ref 3.8–4.9)
Alkaline Phosphatase: 74 IU/L (ref 39–117)
BUN/Creatinine Ratio: 15 (ref 9–20)
BUN: 18 mg/dL (ref 6–24)
Bilirubin Total: 1.5 mg/dL — ABNORMAL HIGH (ref 0.0–1.2)
CO2: 23 mmol/L (ref 20–29)
Calcium: 9.5 mg/dL (ref 8.7–10.2)
Chloride: 103 mmol/L (ref 96–106)
Creatinine, Ser: 1.24 mg/dL (ref 0.76–1.27)
GFR calc Af Amer: 73 mL/min/{1.73_m2} (ref >59)
GFR calc non Af Amer: 63 mL/min/{1.73_m2} (ref >59)
Globulin, Total: 2.4 g/dL (ref 1.5–4.5)
Glucose: 91 mg/dL (ref 65–99)
Potassium: 4.7 mmol/L (ref 3.5–5.2)
Sodium: 139 mmol/L (ref 134–144)
Total Protein: 6.9 g/dL (ref 6.0–8.5)

## 2018-12-30 ENCOUNTER — Telehealth: Payer: Self-pay

## 2018-12-30 NOTE — Telephone Encounter (Signed)
Pt is requesting lab results from 12/28/2018. Please advise. Thanks TNP

## 2018-12-30 NOTE — Telephone Encounter (Signed)
Can you sign off on these tonight so I can call him in the morning Thanks

## 2018-12-31 NOTE — Telephone Encounter (Signed)
Patient has been advised

## 2018-12-31 NOTE — Telephone Encounter (Signed)
Done

## 2019-01-01 NOTE — Telephone Encounter (Signed)
There is nothing else to replace spironolactone He can try half dose, also try different time of day That is the medication helps his heart pump get stronger

## 2019-01-04 NOTE — Telephone Encounter (Signed)
Returned call to patient.   He said he just took one dose of the spironolactone. He will start back today with one tablet and monitor VS and sx.   [facility]. Order is active for BMET.   No further questions or concerns at this time.   Advised pt to call for any further questions or concerns.

## 2019-01-24 ENCOUNTER — Other Ambulatory Visit
Admission: RE | Admit: 2019-01-24 | Discharge: 2019-01-24 | Disposition: A | Discharge status: Home / Self Care | Payer: 59 | Source: Ambulatory Visit | Attending: Cardiovascular Disease | Admitting: Cardiovascular Disease

## 2019-01-24 DIAGNOSIS — Z79899 Other long term (current) drug therapy: Secondary | ICD-10-CM | POA: Insufficient documentation

## 2019-01-24 DIAGNOSIS — I420 Dilated cardiomyopathy: Secondary | ICD-10-CM | POA: Diagnosis not present

## 2019-01-24 LAB — BASIC METABOLIC PANEL
Anion gap: 10 (ref 5–15)
BUN: 18 mg/dL (ref 6–20)
CO2: 26 mmol/L (ref 22–32)
Calcium: 9 mg/dL (ref 8.9–10.3)
Chloride: 105 mmol/L (ref 98–111)
Creatinine, Ser: 1.31 mg/dL — ABNORMAL HIGH (ref 0.61–1.24)
GFR calc Af Amer: >60 mL/min (ref >60)
GFR calc non Af Amer: 59 mL/min — ABNORMAL LOW (ref >60)
Glucose, Bld: 112 mg/dL — ABNORMAL HIGH (ref 70–99)
Potassium: 4 mmol/L (ref 3.5–5.1)
Sodium: 141 mmol/L (ref 135–145)

## 2019-01-27 ENCOUNTER — Telehealth: Payer: Self-pay

## 2019-01-27 NOTE — Telephone Encounter (Signed)
Attempted to call patient. LMTCB 01/27/2019   

## 2019-01-27 NOTE — Telephone Encounter (Signed)
-----   Message from Minna Merritts, MD sent at 01/26/2019  9:38 PM EST ----- Bmp Renal function slightly elevated, high end of range No med changes

## 2019-01-28 NOTE — Telephone Encounter (Signed)
Spoke to patient. Reviewed labs. No further orders at this time.  Advised pt to call for any further questions or concerns.

## 2019-03-04 ENCOUNTER — Ambulatory Visit: Payer: Self-pay

## 2019-03-04 NOTE — Telephone Encounter (Signed)
Returned call to patient who is seeking information for COVID-19 testing. 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 Per Mr Wiessner he is not having symptoms. Per guidelines Mr Dlouhy was informed that he should quarantine 14 days post last time he was with his co-worker. Monitor for symptoms each day. It was explained that symptoms can start from 2 to 14 days after exposure. Average 6-8 days. Mr Horne verbalized understanding and will quarantine and monitor for symptoms. If symptoms develop he will be tested. Patient verbalized understanding.  Reason for Disposition . General information question, no triage required and triager able to answer question  Answer Assessment - Initial Assessment Questions 1. REASON FOR CALL or QUESTION: "What is your reason for calling today?" or "How can I best help you?" or "What question do you have that I can help answer?"     I was exposed to COVID-19 When should I get tested.  Protocols used: INFORMATION ONLY CALL - NO TRIAGE-A-AH

## 2019-03-21 ENCOUNTER — Ambulatory Visit: Payer: 59 | Admitting: Cardiovascular Disease

## 2019-03-28 NOTE — Progress Notes (Signed)
Cardiology Office Note  Date:  03/29/2019   ID:  RUTLEDGE SHALLOW, DOB Oct 19, 1960, MRN [id number]  PCP:  [name], MD   Chief Complaint  Patient presents with  . other    6 month f/u. Tenions in chest. Dizziness. Meds reviewed verbally with pt.    HPI:  Mr. Francesconi is a very pleasant 59 year old gentleman with pectus excavatum  30 year smoking history, stopped in 2008 ,  previously with shortness of breath, improved since he stopped smoking,   episodes of left-sided chest pain and jaw pain typically at rest Echocardiogram showing cardiomyopathy ejection fraction 30 to 35%, Cardiac catheterization May 2020 showing normal coronary arteries, global hypokinesis ejection fraction 30% Dilated nonischemic cardiomyopathy Who presents for follow-up of his dilated cardiomyopathy  covid positive mid Jan 2021 Feels better Back to baseline Had severe sinus symptoms Feels he got it from work, got he works with had Covid  Reports blood pressure running low at home, past month has not had orthostasis symptoms Prior to that was having orthostasis in the morning getting out of bed He cut back on his carvedilol to 1 in the morning, symptoms improved  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[employer], Working with high-voltage lines  No chest pain Twinges here and there, once a month  Denies significant shortness of breath or leg swelling Overall reports feeling well  Prior echocardiograms discussed with him Echocardiogram December 15, 2018 ejection fraction 25 to 30% With addition of spironolactone prior to the study  Cardiac catheterization May 2020 ejection fraction 25 to 35%, no significant coronary disease (He was on Coreg and Entresto at the time)  Again denies alcohol or sleep apnea  EKG personally reviewed by myself on todays visit  shows NSR with rate 69 bpm nonspecific T wave abnormality Frequent PVCs  Other past medical history reviewed Cath 07/15/2018 Normal coronary  arteries, Global hypokinesis confirmed, EF 30% appears global   PMH:   has a past medical history of Chest pain, ED (erectile dysfunction), Heartburn, dysplastic nevus (06/14/2003), Pectus excavatum, and Precancerous lesion.  PSH:    Past Surgical History:  Procedure Laterality Date  . COLONOSCOPY WITH PROPOFOL N/A 10/15/2017   Procedure: COLONOSCOPY WITH PROPOFOL;  Surgeon: Jonathon Bellows, MD;  Location: [facility] ENDOSCOPY;  Service: Gastroenterology;  Laterality: N/A;  . HERNIA REPAIR     X 2  . INGUINAL HERNIA REPAIR Left   . LEFT HEART CATH AND CORONARY ANGIOGRAPHY Left 07/15/2018   Procedure: LEFT HEART CATH AND CORONARY ANGIOGRAPHY;  Surgeon: Minna Merritts, MD;  Location: [place] CV LAB;  Service: Cardiovascular;  Laterality: Left;    Current Outpatient Medications  Medication Sig Dispense Refill  . acetaminophen (TYLENOL) 500 MG tablet Take 500-1,000 mg by mouth every 6 (six) hours as needed (for pain.).    Marland Kitchen aspirin EC 81 MG tablet Take 81 mg by mouth every evening.    Marland Kitchen atorvastatin (LIPITOR) 10 MG tablet Take 1 tablet (10 mg total) by mouth daily. 90 tablet 0  . Biotin 1000 MCG tablet Take 1,000 mcg by mouth every evening.    . carvedilol (COREG) 3.125 MG tablet Take 1 tablet (3.125 mg total) by mouth 2 (two) times daily. 180 tablet 3  . ibuprofen (ADVIL) 200 MG tablet Take 400 mg by mouth every 8 (eight) hours as needed (for pain.).    Marland Kitchen naproxen sodium (ALEVE) 220 MG tablet Take 220 mg by mouth 2 (two) times daily as needed (pain.).    Marland Kitchen  sacubitril-valsartan (ENTRESTO) 24-26 MG Take 1 tablet by mouth 2 (two) times daily. 60 tablet 11  . sildenafil (VIAGRA) 50 MG tablet Take 1 tablet (50 mg total) by mouth daily as needed for erectile dysfunction. 10 tablet 5  . spironolactone (ALDACTONE) 25 MG tablet Take 1 tablet (25 mg total) by mouth daily. 30 tablet 6  . valACYclovir (VALTREX) 1000 MG tablet 2 pills every 12 hours for one day at onset of fever blister/cold sore (Patient  taking differently: Take 1,000 mg by mouth 2 (two) times daily as needed (cold sores/fever blisters.). ) 12 tablet 1   No current facility-administered medications for this visit.     Allergies:   Penicillins   Social History:  The patient  reports that he quit smoking about 12 years ago. His smoking use included cigarettes. He has a 30.00 pack-year smoking history. He has never used smokeless tobacco. He reports current alcohol use of about 2.0 standard drinks of alcohol per week. He reports that he does not use drugs.   Family History:   family history includes Dementia in his mother; Diabetes in his mother; Hypertension in his father and mother; Leukemia in his brother; Pulmonary fibrosis in his father.    Review of Systems: Review of Systems  Constitutional: Negative.   HENT: Negative.   Respiratory: Negative.   Cardiovascular: Negative.   Gastrointestinal: Negative.   Musculoskeletal: Negative.   Neurological: Positive for dizziness.  Psychiatric/Behavioral: Negative.   All other systems reviewed and are negative.   PHYSICAL EXAM: VS:  BP 100/64 (BP Location: Left Arm, Patient Position: Sitting, Cuff Size: Normal)   Pulse 69   Ht 5\' 11"  (1.803 m)   Wt 201 lb 2 oz (91.2 kg)   SpO2 96%   BMI 28.05 kg/m  , BMI Body mass index is 28.05 kg/m. Constitutional:  oriented to person, place, and time. No distress.  HENT:  Head: Grossly normal Eyes:  no discharge. No scleral icterus.  Neck: No JVD, no carotid bruits  Cardiovascular: Regular rate and rhythm, no murmurs appreciated Frequent ectopy Pulmonary/Chest: Clear to auscultation bilaterally, no wheezes or rails Abdominal: Soft.  no distension.  no tenderness.  Musculoskeletal: Normal range of motion Neurological:  normal muscle tone. Coordination normal. No atrophy Skin: Skin warm and dry Psychiatric: normal affect, pleasant   Recent Labs: 12/28/2018: ALT 23; Hemoglobin 16.1; Platelets 276 01/24/2019: BUN 18;  Creatinine, Ser 1.31; Potassium 4.0; Sodium 141    Lipid Panel Lab Results  Component Value Date   CHOL 130 09/15/2017   HDL 54 09/15/2017   LDLCALC 62 09/15/2017   TRIG 69 09/15/2017      Wt Readings from Last 3 Encounters:  03/29/19 201 lb 2 oz (91.2 kg)  12/28/18 198 lb (89.8 kg)  09/20/18 196 lb (88.9 kg)      ASSESSMENT AND PLAN:  Frequent PVCs Noted previously, in the setting of cardiomyopathy this is a concern Also seen on EKG today We will order a monitor for PVC burden  Pectus excavatum Has been relatively asymptomatic  Dilated cardiomyopathy On extresto, coreg  Spironolactone Reports having orthostasis on Coreg twice daily Long discussion concerning his depressed ejection fraction and need to continue work-up for etiology --Cardiac MRI ordered --Monitor for PVC burden --Case discussed with EP today, Will look at MRI for scarring to determine next course of action  Mixed hyperlipidemia -  At goal, no changes to the medications  Disposition:   F/U 6 months    Total encounter time  more than 25 minutes  Greater than 50% was spent in counseling and coordination of care with the patient    Orders Placed This Encounter  Procedures  . EKG 12-Lead     Signed, Esmond Plants, M.D., Ph.D. 03/29/2019  [place], [place]

## 2019-03-29 ENCOUNTER — Ambulatory Visit (INDEPENDENT_AMBULATORY_CARE_PROVIDER_SITE_OTHER): Payer: 59 | Admitting: Cardiovascular Disease

## 2019-03-29 ENCOUNTER — Ambulatory Visit (INDEPENDENT_AMBULATORY_CARE_PROVIDER_SITE_OTHER): Payer: 59

## 2019-03-29 ENCOUNTER — Other Ambulatory Visit: Payer: Self-pay

## 2019-03-29 ENCOUNTER — Encounter: Payer: Self-pay | Admitting: Cardiovascular Disease

## 2019-03-29 VITALS — BP 100/64 | HR 69 | Ht 71.0 in | Wt 201.1 lb

## 2019-03-29 DIAGNOSIS — I493 Ventricular premature depolarization: Secondary | ICD-10-CM | POA: Diagnosis not present

## 2019-03-29 DIAGNOSIS — F17200 Nicotine dependence, unspecified, uncomplicated: Secondary | ICD-10-CM | POA: Diagnosis not present

## 2019-03-29 DIAGNOSIS — E782 Mixed hyperlipidemia: Secondary | ICD-10-CM | POA: Diagnosis not present

## 2019-03-29 DIAGNOSIS — I428 Other cardiomyopathies: Secondary | ICD-10-CM

## 2019-03-29 DIAGNOSIS — I420 Dilated cardiomyopathy: Secondary | ICD-10-CM | POA: Diagnosis not present

## 2019-03-29 DIAGNOSIS — R002 Palpitations: Secondary | ICD-10-CM

## 2019-03-29 NOTE — Patient Instructions (Addendum)
Medication Instructions:  No changes  If you need a refill on your cardiac medications before your next appointment, please call your pharmacy.    Lab work: No new labs needed   If you have labs (blood work) drawn today and your tests are completely normal, you will receive your results only by: Marland Kitchen MyChart Message (if you have MyChart) OR . A paper copy in the mail If you have any lab test that is abnormal or we need to change your treatment, we will call you to review the results.   Testing/Procedures: You are scheduled for Cardiac MRI on ______. Please arrive at the [facility] main entrance of [facility] at _____ (30-45 minutes prior to test start time). ?  [facility]  [address]  [place], [place] [address]  [phone]  Proceed to the [facility] Radiology Department (First Floor).  ?  Magnetic resonance imaging (MRI) is a painless test that produces images of the inside of the body without using X-rays. During an MRI, strong magnets and radio waves work together in a Research officer, political party to form detailed images. MRI images may provide more details about a medical condition than X-rays, CT scans, and ultrasounds can provide.  You may be given earphones to listen for instructions.  You may eat a light breakfast and take medications as ordered with the exception of Spironolactone (fluid pill, other). If a contrast material will be used, an IV will be inserted into one of your veins. Contrast material will be injected into your IV.  You will be asked to remove all metal, including: Watch, jewelry, and other metal objects including hearing aids, hair pieces and dentures. (Braces and fillings normally are not a problem.)  If contrast material was used:  It will leave your body through your urine within a day. You may be told to drink plenty of fluids to help flush the contrast material out of your system.  TEST WILL TAKE APPROXIMATELY 1 HOUR  PLEASE NOTIFY  SCHEDULING AT LEAST 24 HOURS IN ADVANCE IF YOU ARE UNABLE TO KEEP YOUR APPOINTMENT.        Your physician has recommended that you wear a Zio monitor. This monitor is a medical device that records the heart's electrical activity. Doctors most often use these monitors to diagnose arrhythmias. Arrhythmias are problems with the speed or rhythm of the heartbeat. The monitor is a small device applied to your chest. You can wear one while you do your normal daily activities. While wearing this monitor if you have any symptoms to push the button and record what you felt. Once you have worn this monitor for the period of time provider prescribed (Usually 14 days), you will return the monitor device in the postage paid box. Once it is returned they will download the data collected and provide [place] with a report which the provider will then review and we will call you with those results. Important tips:  1. Avoid showering during the first 24 hours of wearing the monitor. 2. Avoid excessive sweating to help maximize wear time. 3. Do not submerge the device, no hot tubs, and no swimming pools. 4. Keep any lotions or oils away from the patch. 5. After 24 hours you may shower with the patch on. Take brief showers with your back facing the shower head.  6. Do not remove patch once it has been placed because that will interrupt data and decrease adhesive wear time. 7. Push the button when  you have any symptoms and write down what you were feeling. 8. Once you have completed wearing your monitor, remove and place into box which has postage paid and place in your outgoing mailbox.  9. If for some reason you have misplaced your box then call our office and we can provide another box and/or mail it off for you.         Follow-Up: At [facility], you and your health needs are our priority.  As part of our continuing mission to provide you with exceptional heart care, we have created designated Provider Care  Teams.  These Care Teams include your primary Cardiologist (physician) and Advanced Practice Providers (APPs -  Physician Assistants and Nurse Practitioners) who all work together to provide you with the care you need, when you need it.  . You will need a follow up appointment in 12 months  . Providers on your designated Care Team:   . Murray Hodgkins, NP . Christell Faith, PA-C . Marrianne Mood, PA-C  Any Other Special Instructions Will Be Listed Below (If Applicable).  For educational health videos Log in to : [email or web address] Or : [email or web address], password : [email or web address]

## 2019-04-04 ENCOUNTER — Telehealth: Payer: Self-pay | Admitting: *Deleted

## 2019-04-04 ENCOUNTER — Other Ambulatory Visit
Admission: RE | Admit: 2019-04-04 | Discharge: 2019-04-04 | Disposition: A | Discharge status: Home / Self Care | Payer: 59 | Source: Ambulatory Visit | Attending: Cardiovascular Disease | Admitting: Cardiovascular Disease

## 2019-04-04 DIAGNOSIS — I420 Dilated cardiomyopathy: Secondary | ICD-10-CM | POA: Diagnosis present

## 2019-04-04 LAB — BASIC METABOLIC PANEL
Anion gap: 5 (ref 5–15)
BUN: 18 mg/dL (ref 6–20)
CO2: 27 mmol/L (ref 22–32)
Calcium: 8.8 mg/dL — ABNORMAL LOW (ref 8.9–10.3)
Chloride: 107 mmol/L (ref 98–111)
Creatinine, Ser: 1.36 mg/dL — ABNORMAL HIGH (ref 0.61–1.24)
GFR calc Af Amer: >60 mL/min (ref >60)
GFR calc non Af Amer: 57 mL/min — ABNORMAL LOW (ref >60)
Glucose, Bld: 123 mg/dL — ABNORMAL HIGH (ref 70–99)
Potassium: 4 mmol/L (ref 3.5–5.1)
Sodium: 139 mmol/L (ref 135–145)

## 2019-04-04 NOTE — Telephone Encounter (Signed)
-----   Message from Roland Earl sent at 04/04/2019 10:30 AM EST ----- Regarding: RE: Cardiac MRI/Precert Good morning.  This patient will need an order for a BMET, please. ----- Message ----- From: Valora Corporal, RN Sent: 03/29/2019   9:23 AM EST To: Roland Earl, # Subject: Cardiac MRI/Precert                            Dr. Rockey Situ wants to order Cardiac MRI for this patient to have done at main [facility]. It is for non-ischemic cardiomyopathy EF is 25-30% and he is post COVID. Please let me know if you need any additional information for him to have this scheduled.  Thanks, Olin Hauser

## 2019-04-04 NOTE — Telephone Encounter (Signed)
Spoke with patient and let him know that we needed updated BMP in order to get his MRI scheduled. He verbalized understanding and will go over to have it done here in a few minutes. Order entered for [facility] and provided instructions on where to go for that.

## 2019-04-06 ENCOUNTER — Encounter: Payer: Self-pay | Admitting: Cardiovascular Disease

## 2019-04-06 ENCOUNTER — Telehealth: Payer: Self-pay | Admitting: Cardiovascular Disease

## 2019-04-06 NOTE — Telephone Encounter (Signed)
Left message regarding appointment for Cardiac MRI scheduled 05/02/19 at 8:00 am at [facility]---arrival time is 7:15 am 1st floor admissions office.   Will mail information to patient

## 2019-04-29 ENCOUNTER — Telehealth (HOSPITAL_COMMUNITY): Payer: Self-pay | Admitting: Emergency Medicine

## 2019-04-29 NOTE — Telephone Encounter (Signed)
Reaching out to patient to offer assistance regarding upcoming cardiac imaging study; pt verbalizes understanding of appt date/time, parking situation and where to check in, and verified current allergies; name and call back number provided for further questions should they arise Marchia Bond RN Navigator Cardiac Imaging [facility] [phone] office [phone] cell  Denies implants, denies claustro, denies diabetes/supplies, denies shrapnel

## 2019-05-02 ENCOUNTER — Ambulatory Visit (HOSPITAL_COMMUNITY)
Admission: RE | Admit: 2019-05-02 | Discharge: 2019-05-02 | Disposition: A | Discharge status: Home / Self Care | Payer: 59 | Source: Ambulatory Visit | Attending: Cardiovascular Disease | Admitting: Cardiovascular Disease

## 2019-05-02 ENCOUNTER — Other Ambulatory Visit: Payer: Self-pay

## 2019-05-02 DIAGNOSIS — I428 Other cardiomyopathies: Secondary | ICD-10-CM | POA: Insufficient documentation

## 2019-05-02 MED ORDER — GADOBUTROL 1 MMOL/ML IV SOLN
10.0000 mL | Freq: Once | INTRAVENOUS | Status: AC | PRN
  Administered 2019-05-02: 10 mL via INTRAVENOUS

## 2019-05-09 ENCOUNTER — Other Ambulatory Visit: Payer: Self-pay | Admitting: *Deleted

## 2019-05-09 DIAGNOSIS — I493 Ventricular premature depolarization: Secondary | ICD-10-CM

## 2019-05-09 DIAGNOSIS — I420 Dilated cardiomyopathy: Secondary | ICD-10-CM

## 2019-05-09 NOTE — Progress Notes (Signed)
Pam rn to enter referral

## 2019-05-10 ENCOUNTER — Encounter: Payer: Self-pay | Admitting: Internal Medicine

## 2019-05-10 NOTE — Telephone Encounter (Signed)
This encounter was created in error - please disregard.

## 2019-05-19 ENCOUNTER — Other Ambulatory Visit: Payer: Self-pay

## 2019-05-19 ENCOUNTER — Encounter: Payer: Self-pay | Admitting: Internal Medicine

## 2019-05-19 ENCOUNTER — Ambulatory Visit (INDEPENDENT_AMBULATORY_CARE_PROVIDER_SITE_OTHER): Payer: 59 | Admitting: Internal Medicine

## 2019-05-19 VITALS — BP 112/78 | HR 60 | Ht 71.0 in | Wt 201.5 lb

## 2019-05-19 DIAGNOSIS — I420 Dilated cardiomyopathy: Secondary | ICD-10-CM

## 2019-05-19 DIAGNOSIS — I493 Ventricular premature depolarization: Secondary | ICD-10-CM | POA: Diagnosis not present

## 2019-05-19 MED ORDER — AMIODARONE HCL 400 MG PO TABS: ORAL_TABLET | ORAL | 0 refills | Status: DC

## 2019-05-19 NOTE — Patient Instructions (Signed)
Medication Instructions:  - Your physician has recommended you make the following change in your medication:   1) Start amiodarone 400 mg: - take 1 tablet by mouth TWICE daily x 2 weeks, then - take 1 tablet by mouth ONCE daily x 2 weeks, then  Start amiodarone 200 mg - take 1 tablet by mouth ONCE daily  *If you need a refill on your cardiac medications before your next appointment, please call your pharmacy*   Lab Work: - none ordered  If you have labs (blood work) drawn today and your tests are completely normal, you will receive your results only by: Marland Kitchen MyChart Message (if you have MyChart) OR . A paper copy in the mail If you have any lab test that is abnormal or we need to change your treatment, we will call you to review the results.   Testing/Procedures: - Your physician has recommended that you wear a 72 hour Zio (heart) monitor- in 6 weeks: we will mail this to your home address. This monitor is a medical device that records the heart's electrical activity. Doctors most often use these monitors to diagnose arrhythmias. Arrhythmias are problems with the speed or rhythm of the heartbeat. The monitor is a small device applied to your chest. You can wear one while you do your normal daily activities. While wearing this monitor if you have any symptoms to push the button and record what you felt. Once you have worn this monitor for the period of time provider prescribed (Usually 14 days), you will return the monitor device in the postage paid box. Once it is returned they will download the data collected and provide [place] with a report which the provider will then review and we will call you with those results. Important tips:  1. Avoid showering during the first 24 hours of wearing the monitor. 2. Avoid excessive sweating to help maximize wear time. 3. Do not submerge the device, no hot tubs, and no swimming pools. 4. Keep any lotions or oils away from the patch. 5. After 24 hours you  may shower with the patch on. Take brief showers with your back facing the shower head.  6. Do not remove patch once it has been placed because that will interrupt data and decrease adhesive wear time. 7. Push the button when you have any symptoms and write down what you were feeling. 8. Once you have completed wearing your monitor, remove and place into box which has postage paid and place in your outgoing mailbox.  9. If for some reason you have misplaced your box then call our office and we can provide another box and/or mail it off for you.        - Your physician has requested that you have an echocardiogram (in 8 weeks) Echocardiography is a painless test that uses sound waves to create images of your heart. It provides your doctor with information about the size and shape of your heart and how well your heart's chambers and valves are working. This procedure takes approximately one hour. There are no restrictions for this procedure.   Follow-Up: At [facility], you and your health needs are our priority.  As part of our continuing mission to provide you with exceptional heart care, we have created designated Provider Care Teams.  These Care Teams include your primary Cardiologist (physician) and Advanced Practice Providers (APPs -  Physician Assistants and Nurse Practitioners) who all work together to provide you with the care you need, when you  need it.  We recommend signing up for the patient portal called "MyChart".  Sign up information is provided on this After Visit Summary.  MyChart is used to connect with patients for Virtual Visits (Telemedicine).  Patients are able to view lab/test results, encounter notes, upcoming appointments, etc.  Non-urgent messages can be sent to your provider as well.   To learn more about what you can do with MyChart, go to [email or web address].    Your next appointment:   9-10 weeks   The format for your next appointment:   In  Person  Provider:   Virl Axe, MD   Other Instructions n/a

## 2019-05-19 NOTE — Progress Notes (Signed)
ELECTROPHYSIOLOGY CONSULT NOTE  Patient ID: Daniel Benson, MRN: [id number], DOB/AGE: Jul 13, 1960 59 y.o. Admit date: (Not on file) Date of Consult: 05/19/2019  Primary Physician: [name], MD Primary Cardiologist: [name] is a 59 y.o. male who is being seen today for the evaluation of PVCs at the request of TG.    HPI Daniel Benson is a 59 y.o. male referred for high-volume PVCs in the setting of a nonischemic cardiomyopathy associated not withstanding with significant late gadolinium enhancement and a transmural inferior wall defect.  He has a longstanding awareness of having less exercise tolerance and his colleagues.  This has gotten worse over recent months.  He is largely unaware of his PVCs although he is now able to detect skips.  Occasional chest pains-twinges lasting seconds.  No nocturnal dyspnea orthopnea or peripheral edema    DATE TEST EF   5/20 LHC  30 % Cors- w/o significant obstruction  10/20 Echo  25-30% RV dysfn-mild; no RVE  3/21 cMRI 33% LGE-transmural inferior wall and RV attachment to LV     Date Cr K Hgb  2/21 1.36 4.0 16.1         Date PVCs  3/21 27%          COVID 1/21   Past Medical History:  Diagnosis Date  . Chest pain   . ED (erectile dysfunction)   . Heartburn    OCCASIONAL  . Hx of dysplastic nevus 06/14/2003   multiple sites   . Pectus excavatum   . Precancerous lesion    H/O      Surgical History:  Past Surgical History:  Procedure Laterality Date  . COLONOSCOPY WITH PROPOFOL N/A 10/15/2017   Procedure: COLONOSCOPY WITH PROPOFOL;  Surgeon: Jonathon Bellows, MD;  Location: [facility];  Service: Gastroenterology;  Laterality: N/A;  . HERNIA REPAIR     X 2  . INGUINAL HERNIA REPAIR Left   . LEFT HEART CATH AND CORONARY ANGIOGRAPHY Left 07/15/2018   Procedure: LEFT HEART CATH AND CORONARY ANGIOGRAPHY;  Surgeon: Minna Merritts, MD;  Location: [facility];  Service:  Cardiovascular;  Laterality: Left;     Home Meds: Current Meds  Medication Sig  . acetaminophen (TYLENOL) 500 MG tablet Take 500-1,000 mg by mouth every 6 (six) hours as needed (for pain.).  Marland Kitchen aspirin EC 81 MG tablet Take 81 mg by mouth every evening.  Marland Kitchen atorvastatin (LIPITOR) 10 MG tablet Take 1 tablet (10 mg total) by mouth daily.  . Biotin 1000 MCG tablet Take 1,000 mcg by mouth every evening.  . carvedilol (COREG) 3.125 MG tablet Take 1 tablet (3.125 mg total) by mouth 2 (two) times daily.  Marland Kitchen ibuprofen (ADVIL) 200 MG tablet Take 400 mg by mouth every 8 (eight) hours as needed (for pain.).  Marland Kitchen naproxen sodium (ALEVE) 220 MG tablet Take 220 mg by mouth 2 (two) times daily as needed (pain.).  Marland Kitchen sacubitril-valsartan (ENTRESTO) 24-26 MG Take 1 tablet by mouth 2 (two) times daily.  . sildenafil (VIAGRA) 50 MG tablet Take 1 tablet (50 mg total) by mouth daily as needed for erectile dysfunction.  Marland Kitchen spironolactone (ALDACTONE) 25 MG tablet Take 1 tablet (25 mg total) by mouth daily.  . valACYclovir (VALTREX) 1000 MG tablet 2 pills every 12 hours for one day at onset of fever blister/cold sore (Patient taking differently: Take 1,000 mg by mouth 2 (two) times daily as needed (cold  sores/fever blisters.). )    Allergies:  Allergies  Allergen Reactions  . Penicillins Diarrhea    Stomach upset Did it involve swelling of the face/tongue/throat, SOB, or low BP? No Did it involve sudden or severe rash/hives, skin peeling, or any reaction on the inside of your mouth or nose? No Did you need to seek medical attention at a hospital or doctor's office? No When did it last happen? More than 20 years ago If all above answers are "NO", may proceed with cephalosporin use.     Social History   Socioeconomic History  . Marital status: Married    Spouse name: Not on file  . Number of children: Not on file  . Years of education: Not on file  . Highest education level: Not on file  Occupational History    . Occupation: Quarry manager: [employer]  Tobacco Use  . Smoking status: Former Smoker    Packs/day: 1.00    Years: 30.00    Pack years: 30.00    Types: Cigarettes    Quit date: 05/26/2006    Years since quitting: 12.9  . Smokeless tobacco: Never Used  Substance and Sexual Activity  . Alcohol use: Yes    Alcohol/week: 2.0 standard drinks    Types: 2 Cans of beer per week    Comment: OCCASIONAL  . Drug use: No  . Sexual activity: Yes  Other Topics Concern  . Not on file  Social History Narrative  . Not on file   Social Determinants of Health   Financial Resource Strain:   . Difficulty of Paying Living Expenses:   Food Insecurity:   . Worried About Charity fundraiser in the Last Year:   . Arboriculturist in the Last Year:   Transportation Needs:   . Film/video editor (Medical):   Marland Kitchen Lack of Transportation (Non-Medical):   Physical Activity:   . Days of Exercise per Week:   . Minutes of Exercise per Session:   Stress:   . Feeling of Stress :   Social Connections:   . Frequency of Communication with Friends and Family:   . Frequency of Social Gatherings with Friends and Family:   . Attends Religious Services:   . Active Member of Clubs or Organizations:   . Attends Archivist Meetings:   Marland Kitchen Marital Status:   Intimate Partner Violence:   . Fear of Current or Ex-Partner:   . Emotionally Abused:   Marland Kitchen Physically Abused:   . Sexually Abused:      Family History  Problem Relation Age of Onset  . Hypertension Mother   . Diabetes Mother   . Dementia Mother   . Hypertension Father   . Pulmonary fibrosis Father   . Leukemia Brother      ROS:  Please see the history of present illness.     All other systems reviewed and negative.    Physical Exam Blood pressure 112/78, pulse 60, height 5\' 11"  (1.803 m), weight 201 lb 8 oz (91.4 kg), SpO2 98 %. General: Well developed, well nourished male in no acute distress. Head: Normocephalic, atraumatic,  sclera non-icteric, no xanthomas, nares are without discharge. EENT: normal  Lymph Nodes:  none Neck: Negative for carotid bruits. JVD not elevated. Back:without scoliosis kyphosis   Lungs: Clear bilaterally to auscultation without wheezes, rales, or rhonchi. Breathing is unlabored. Pectus Heart: RRR with S1 S2. no murmur . No rubs, or gallops appreciated. Abdomen: Soft, non-tender,  non-distended with normoactive bowel sounds. No hepatomegaly. No rebound/guarding. No obvious abdominal masses. Msk:  Strength and tone appear normal for age. Extremities: No clubbing or cyanosis. No edema.  Distal pedal pulses are 2+ and equal bilaterally. Skin: Warm and Dry Neuro: Alert and oriented X 3. CN III-XII intact Grossly normal sensory and motor function . Psych:  Responds to questions appropriately with a normal affect.      Labs: Cardiac Enzymes No results for input(s): CKTOTAL, CKMB, TROPONINI in the last 72 hours. CBC Lab Results  Component Value Date   WBC 7.9 12/28/2018   HGB 16.1 12/28/2018   HCT 48.3 12/28/2018   MCV 93 12/28/2018   PLT 276 12/28/2018   PROTIME: No results for input(s): LABPROT, INR in the last 72 hours. Chemistry No results for input(s): NA, K, CL, CO2, BUN, CREATININE, CALCIUM, PROT, BILITOT, ALKPHOS, ALT, AST, GLUCOSE in the last 168 hours.  Invalid input(s): LABALBU Lipids Lab Results  Component Value Date   CHOL 130 09/15/2017   HDL 54 09/15/2017   LDLCALC 62 09/15/2017   TRIG 69 09/15/2017   BNP No results found for: PROBNP Thyroid Function Tests: No results for input(s): TSH, T4TOTAL, T3FREE, THYROIDAB in the last 72 hours.  Invalid input(s): FREET3 Miscellaneous No results found for: DDIMER  Radiology/Studies:  MR Card Morphology Wo/W Cm  Result Date: 05/02/2019 CLINICAL DATA:  59 year old male with h/o non-ischemic cardiomyopathy (based on a normal cardiac catheterization in 06/2018), persistent low LVEF and frequent PVCs and runs of nsVT.  EXAM: CARDIAC MRI TECHNIQUE: The patient was scanned on a 1.5 Tesla GE magnet. A dedicated cardiac coil was used. Functional imaging was done using Fiesta sequences. 2,3, and 4 chamber views were done to assess for RWMA's. Modified Simpson's rule using a short axis stack was used to calculate an ejection fraction on a dedicated work Conservation officer, nature. The patient received 11 cc of Gadavist. After 10 minutes inversion recovery sequences were used to assess for infiltration and scar tissue. CONTRAST:  11 cc  of Gadavist FINDINGS: 1. Moderately dilated left ventricle with normal wall thickness and severely decreased systolic function (LVEF = 33%). There is diffuse hypokinesis more pronounced in the mid inferior wall. A focal late gadolinium enhancement is seen in the RV attachment to the left ventricle (consistent with volume overload) and transmural late gadolinium enhancement is seen focally in the mid inferior wall. LVEDD: 65 mm LVESD: 57 mm LVEDV: 211 ml LVESV: 142 ml SV: 69 ml CO: 3.6 L/min Myocardial mass: 2. Normal right ventricular size, thickness and moderately decreased systolic function (LVEF = 32%). There are no regional wall motion abnormalities. 3.  Normal left atrial size, moderately dilated right atrial size. 4. Normal size of the aortic root, ascending aorta and pulmonary artery. 5.  Trivial mitral and mild tricuspid regurgitation. 6.  Normal pericardium.  Minimal pericardial effusion. IMPRESSION: 1. Moderately dilated left ventricle with normal wall thickness and severely decreased systolic function (LVEF = 33%). There is diffuse hypokinesis more pronounced in the mid inferior wall. A focal late gadolinium enhancement is seen in the RV attachment to the left ventricle (consistent with volume overload) and transmural late gadolinium enhancement is seen focally in the mid inferior wall. 2. Normal right ventricular size, thickness and moderately decreased systolic function (LVEF = 32%). There  are no regional wall motion abnormalities. 3.  Normal left atrial size, moderately dilated right atrial size. 4. Normal size of the aortic root, ascending aorta and pulmonary artery. 5.  Trivial mitral and mild tricuspid regurgitation. 6.  Normal pericardium.  Minimal pericardial effusion. There is no evidence for an infiltrative or inflammatory cardiomyopathy. There is a focal transmural scar in the mid inferior wall highly suspicious for a prior focal embolic infarct. This might be a source of ventricular ectopy and runs of nsVT. Electronically Signed   By: Ena Dawley   On: 05/02/2019 12:15    EKG: Sinus rhythm at 60 Interval 18/10/42 PVCs in a pattern of quadrigeminy with a left bundle indeterminate axis, biphasic predominantly negative in leads II 3 and F with a very fractionated QRS   Assessment and Plan:  Cardiomyopathy-nonischemic  cMRI abnormal with full-thickness inferior scar question cause  PVCs left bundle indeterminate axis-fractionated  Congestive heart failure-chronic-systolic class II  Pectus excavatum-moderate-/severe    The patient has a nonischemic cardiomyopathy that has been persistent with the initiation of guideline directed therapy.  It is associated with PVCs.  The abnormal MRI is concerning that the PVCs are consequential to the myopathy not the other way around; the fractionated nature of the PVCs also suggests a relationship to the cardiomyopathy.  Hence, prior to undertaking considering ablation, we will try medication suppression.  With his cardiomyopathy we will use amiodarone with the duration of about 6-8 weeks.  At that point we will reassess PVC burden and thereafter, if suppressed, reassess LV function.  He has some degree of sinus bradycardia; he is advised that if his bradycardia worsens he should hold his carvedilol.  I also think that CT of his chest is in order to measure the anterior posterior ratio to assess the severity of his pectus.  He  is relatively young although later than reconstruction of the pectus is typically undertaken (as best as I read quickly from up-to-date  Have reviewed the side effect issues of amiodarone.  We will check LFTs and TSH      Virl Axe

## 2019-05-20 ENCOUNTER — Other Ambulatory Visit: Payer: Self-pay

## 2019-05-20 ENCOUNTER — Telehealth: Payer: Self-pay | Admitting: Internal Medicine

## 2019-05-20 MED ORDER — AMIODARONE HCL 400 MG PO TABS: ORAL_TABLET | ORAL | 0 refills | Status: DC

## 2019-05-20 NOTE — Telephone Encounter (Signed)
Pt c/o medication issue:  1. Name of Medication: Amiodarone    2. How are you currently taking this medication (dosage and times per day)? 400 BID x 2 wks then 400 q d   3. Are you having a reaction (difficulty breathing--STAT)? no  4. What is your medication issue? Patient states he needs 42 pills for the month and he was dispensed 18 .  Please advise.

## 2019-05-20 NOTE — Telephone Encounter (Signed)
amiodarone (PACERONE) 400 MG tablet 42 tablet 0 05/20/2019    Sig: Take 1 tablet (400 mg) by mouth twice daily x 2 weeks, then take 1 tablet (400 mg) by mouth once daily   Notes to Pharmacy: Updated RX- 1st RX had an insufficient quantity, please use this RX. Maintenance RX for lower dose amio to be sent at a later date   Pharmacy  [id number] [place], [place] - [address]

## 2019-05-31 ENCOUNTER — Other Ambulatory Visit: Payer: Self-pay | Admitting: *Deleted

## 2019-05-31 MED ORDER — AMIODARONE HCL 200 MG PO TABS: 200.0000 mg | ORAL_TABLET | Freq: Every day | ORAL | 6 refills | Status: DC

## 2019-06-03 NOTE — Telephone Encounter (Signed)
Please review for amiodarone dose.

## 2019-06-03 NOTE — Telephone Encounter (Signed)
Patient calling States that the pharmacy gave him 200 MG amiodarone which he needs as well but the 400 MG is what he is 7 pills short of  Please clarify with pharmacy and [facility] extra [facility] [address].

## 2019-06-06 MED ORDER — AMIODARONE HCL 400 MG PO TABS: 400.0000 mg | ORAL_TABLET | Freq: Every day | ORAL | 0 refills | Status: DC

## 2019-06-06 NOTE — Addendum Note (Signed)
Addended by: Lamar Laundry on: 06/06/2019 10:26 AM   Modules accepted: Orders

## 2019-06-06 NOTE — Telephone Encounter (Addendum)
Contacted [facility] and spoke with the pharmacist Ryan. The patient Rx for Amiodarone 400mg  was dispensed for a quantity of 35 tablets. The patient would need 42 tablest of the 400 mg to complete his medication loading. They filled the Rx with the incorrect quantity of 35 tablets and have the corrected 42 tablet quantity filled and ready to be picked up. Eugenie Birks that the patient does not need an additional 42 tabs od the 400mg , just 7 to be added to the 35 he has  picked up. Amiodarone 400 mg bid x 2 weeks, Then take 400 mg qd x 2weeks.  Patient has picked up the Rx for Amiodarone 400 mg tablets (35 tablets) and the maintenance Rx for Amiodarone 200 mg qd tablets. Per Thurmond Butts an Rx for 7 tablets for the 400 mg will need to be sent. (done)  Spoke with the patient and made him aware. Patient voiced appreciation for the assistance.

## 2019-06-19 ENCOUNTER — Other Ambulatory Visit: Payer: Self-pay | Admitting: Cardiovascular Disease

## 2019-06-29 ENCOUNTER — Ambulatory Visit (INDEPENDENT_AMBULATORY_CARE_PROVIDER_SITE_OTHER): Payer: 59

## 2019-06-29 DIAGNOSIS — I493 Ventricular premature depolarization: Secondary | ICD-10-CM

## 2019-07-15 ENCOUNTER — Other Ambulatory Visit: Payer: Self-pay

## 2019-07-15 ENCOUNTER — Ambulatory Visit (INDEPENDENT_AMBULATORY_CARE_PROVIDER_SITE_OTHER): Payer: 59

## 2019-07-15 DIAGNOSIS — I420 Dilated cardiomyopathy: Secondary | ICD-10-CM | POA: Diagnosis not present

## 2019-07-15 DIAGNOSIS — I493 Ventricular premature depolarization: Secondary | ICD-10-CM

## 2019-07-15 MED ORDER — PERFLUTREN LIPID MICROSPHERE
1.0000 mL | INTRAVENOUS | Status: AC | PRN
  Administered 2019-07-15: 2 mL via INTRAVENOUS

## 2019-07-19 ENCOUNTER — Other Ambulatory Visit: Payer: Self-pay | Admitting: Internal Medicine

## 2019-07-21 ENCOUNTER — Other Ambulatory Visit: Payer: Self-pay | Admitting: Cardiovascular Disease

## 2019-07-27 ENCOUNTER — Other Ambulatory Visit: Payer: Self-pay | Admitting: *Deleted

## 2019-07-28 ENCOUNTER — Other Ambulatory Visit: Payer: Self-pay | Admitting: *Deleted

## 2019-07-28 ENCOUNTER — Ambulatory Visit: Payer: 59 | Admitting: Internal Medicine

## 2019-07-28 MED ORDER — CARVEDILOL 3.125 MG PO TABS: 3.1250 mg | ORAL_TABLET | Freq: Two times a day (BID) | ORAL | 0 refills | Status: DC

## 2019-07-28 NOTE — Telephone Encounter (Signed)
Pt/pharmacy requesting new Rx for carvedilol 3.125 mg tablet qd. Pt is taking only once daily and the original Rx is for Carvedilol 3.125 mg tablet bid. Please advise for medication request.

## 2019-08-06 ENCOUNTER — Other Ambulatory Visit: Payer: Self-pay | Admitting: Cardiovascular Disease

## 2019-08-11 ENCOUNTER — Other Ambulatory Visit: Payer: Self-pay

## 2019-08-11 DIAGNOSIS — E782 Mixed hyperlipidemia: Secondary | ICD-10-CM

## 2019-08-12 ENCOUNTER — Other Ambulatory Visit: Payer: Self-pay | Admitting: Family Medicine

## 2019-08-12 DIAGNOSIS — E782 Mixed hyperlipidemia: Secondary | ICD-10-CM

## 2019-08-12 NOTE — Telephone Encounter (Signed)
Requested medication (s) are due for refill today:  Yes  Requested medication (s) are on the active medication list:   Yes  Future visit scheduled:   Yes 09/05/2019   Last ordered: 10/11/2018 #90 0 refills  Returned because labs are due so did not pass protocol.    Requested Prescriptions  Pending Prescriptions Disp Refills   atorvastatin (LIPITOR) 10 MG tablet [Pharmacy Med Name: ATORVASTATIN 10 MG TABLET] 90 tablet 1    Sig: TAKE 1 TABLET BY MOUTH EVERY DAY      Cardiovascular:  Antilipid - Statins Failed - 08/12/2019 11:01 AM      Failed - Total Cholesterol in normal range and within 360 days    Cholesterol, Total  Date Value Ref Range Status  09/15/2017 130 100 - 199 mg/dL Final          Failed - LDL in normal range and within 360 days    LDL Calculated  Date Value Ref Range Status  09/15/2017 62 0 - 99 mg/dL Final          Failed - HDL in normal range and within 360 days    HDL  Date Value Ref Range Status  09/15/2017 54 >39 mg/dL Final          Failed - Triglycerides in normal range and within 360 days    Triglycerides  Date Value Ref Range Status  09/15/2017 69 0 - 149 mg/dL Final          Passed - Patient is not pregnant      Passed - Valid encounter within last 12 months    Recent Outpatient Visits           7 months ago Dizziness   [facility], Vickki Muff, [place]   1 year ago Tinnitus of left ear   [place], Clearnce Sorrel, [place]   1 year ago [place], [place]   1 year ago Screening for colon cancer   [place], [place]   2 years ago Viral upper respiratory tract infection   [place], Herbie [place], [place]       Future Appointments             In 6 days Deboraha Sprang, MD [facility]   In 3 weeks [name], MD [facility], [place]

## 2019-08-17 ENCOUNTER — Ambulatory Visit: Payer: 59 | Admitting: Family Medicine

## 2019-08-18 ENCOUNTER — Other Ambulatory Visit: Payer: Self-pay

## 2019-08-18 ENCOUNTER — Encounter: Payer: Self-pay | Admitting: Internal Medicine

## 2019-08-18 ENCOUNTER — Ambulatory Visit (INDEPENDENT_AMBULATORY_CARE_PROVIDER_SITE_OTHER): Payer: 59 | Admitting: Internal Medicine

## 2019-08-18 VITALS — BP 106/70 | HR 52 | Ht 71.0 in | Wt 191.5 lb

## 2019-08-18 DIAGNOSIS — Z79899 Other long term (current) drug therapy: Secondary | ICD-10-CM

## 2019-08-18 DIAGNOSIS — I428 Other cardiomyopathies: Secondary | ICD-10-CM | POA: Diagnosis not present

## 2019-08-18 DIAGNOSIS — I493 Ventricular premature depolarization: Secondary | ICD-10-CM | POA: Diagnosis not present

## 2019-08-18 MED ORDER — SPIRONOLACTONE 25 MG PO TABS: ORAL_TABLET | ORAL | Status: DC

## 2019-08-18 MED ORDER — CARVEDILOL 3.125 MG PO TABS: 3.1250 mg | ORAL_TABLET | Freq: Two times a day (BID) | ORAL | 1 refills | Status: DC

## 2019-08-18 NOTE — Patient Instructions (Signed)
Medication Instructions:  - Your physician has recommended you make the following change in your medication:   1) Increase coreg (carvedilol) 3.125 mg- take 1 tablet by mouth twice daily  2) Change spironolactone 25 mg- take 1 tablet by mouth once daily at bedtime  *If you need a refill on your cardiac medications before your next appointment, please call your pharmacy*   Lab Work: - Your physician recommends that you have lab work today: Liver/ TSH  If you have labs (blood work) drawn today and your tests are completely normal, you will receive your results only by: Marland Kitchen MyChart Message (if you have MyChart) OR . A paper copy in the mail If you have any lab test that is abnormal or we need to change your treatment, we will call you to review the results.   Testing/Procedures: - Your physician has requested that you have an echocardiogram in 2-3 months. Echocardiography is a painless test that uses sound waves to create images of your heart. It provides your doctor with information about the size and shape of your heart and how well your heart's chambers and valves are working. This procedure takes approximately one hour. There are no restrictions for this procedure.  Follow-Up: At [facility], you and your health needs are our priority.  As part of our continuing mission to provide you with exceptional heart care, we have created designated Provider Care Teams.  These Care Teams include your primary Cardiologist (physician) and Advanced Practice Providers (APPs -  Physician Assistants and Nurse Practitioners) who all work together to provide you with the care you need, when you need it.  We recommend signing up for the patient portal called "MyChart".  Sign up information is provided on this After Visit Summary.  MyChart is used to connect with patients for Virtual Visits (Telemedicine).  Patients are able to view lab/test results, encounter notes, upcoming appointments, etc.  Non-urgent  messages can be sent to your provider as well.   To learn more about what you can do with MyChart, go to [email or web address].    Your next appointment:   2 weeks after your echocardiogram is completed   The format for your next appointment:   In Person  Provider:   Virl Axe, MD   Other Instructions n/a

## 2019-08-18 NOTE — Progress Notes (Signed)
Patient Care Team: [name], MD as PCP - General (Unknown Physician Specialty)   HPI  Daniel Benson is a 59 y.o. male seen in followup for PVC and NICM with abnormal MRI.  PVC fractionated.  Amiodarone initiated for suppression trial  3/21  Noted no change in functional status.  Mild sluggishness but no real shortness of breath or edema.  Patient denies symptoms of GI intolerance, sun sensitivity, neurological symptoms attributable to amiodarone.      DATE TEST EF   5/20 LHC  30 % Cors- w/o significant obstruction  10/20 Echo  25-30% RV dysfn-mild; no RVE  3/21 cMRI 33% LGE-transmural inferior wall and RV attachment to LV   5/21 Echo 30%     Date Cr K TSH LFTs Hgb  11/20   1.52( 11/19) 23   2/21 1.36 4.0   16.1           Date PVCs  3/21 27%  5/21  1%      Records and Results Reviewed   Past Medical History:  Diagnosis Date  . Chest pain   . ED (erectile dysfunction)   . Heartburn    OCCASIONAL  . Hx of dysplastic nevus 06/14/2003   multiple sites   . Pectus excavatum   . Precancerous lesion    H/O    Past Surgical History:  Procedure Laterality Date  . COLONOSCOPY WITH PROPOFOL N/A 10/15/2017   Procedure: COLONOSCOPY WITH PROPOFOL;  Surgeon: Jonathon Bellows, MD;  Location: [facility] ENDOSCOPY;  Service: Gastroenterology;  Laterality: N/A;  . HERNIA REPAIR     X 2  . INGUINAL HERNIA REPAIR Left   . LEFT HEART CATH AND CORONARY ANGIOGRAPHY Left 07/15/2018   Procedure: LEFT HEART CATH AND CORONARY ANGIOGRAPHY;  Surgeon: Minna Merritts, MD;  Location: [place] [facility] LAB;  Service: Cardiovascular;  Laterality: Left;    Current Meds  Medication Sig  . acetaminophen (TYLENOL) 500 MG tablet Take 500-1,000 mg by mouth every 6 (six) hours as needed (for pain.).  Marland Kitchen amiodarone (PACERONE) 200 MG tablet Take 1 tablet (200 mg total) by mouth daily.  Marland Kitchen aspirin EC 81 MG tablet Take 81 mg by mouth every evening.  Marland Kitchen atorvastatin (LIPITOR)  10 MG tablet TAKE 1 TABLET BY MOUTH EVERY DAY  . Biotin 1000 MCG tablet Take 1,000 mcg by mouth every evening.  . carvedilol (COREG) 3.125 MG tablet Take 1 tablet (3.125 mg total) by mouth 2 (two) times daily.  Marland Kitchen ENTRESTO 24-26 MG TAKE 1 TABLET BY MOUTH TWICE A DAY  . ibuprofen (ADVIL) 200 MG tablet Take 400 mg by mouth every 8 (eight) hours as needed (for pain.).  Marland Kitchen ketorolac (ACULAR) 0.5 % ophthalmic solution Place 1 drop into the right eye 4 (four) times daily.   . naproxen sodium (ALEVE) 220 MG tablet Take 220 mg by mouth 2 (two) times daily as needed (pain.).  Marland Kitchen prednisoLONE acetate (PRED FORTE) 1 % ophthalmic suspension Place 1 drop into the right eye every 2 (two) hours while awake.   . sildenafil (VIAGRA) 50 MG tablet Take 1 tablet (50 mg total) by mouth daily as needed for erectile dysfunction.  Marland Kitchen spironolactone (ALDACTONE) 25 MG tablet TAKE 1 TABLET BY MOUTH EVERY DAY  . valACYclovir (VALTREX) 1000 MG tablet 2 pills every 12 hours for one day at onset of fever blister/cold sore (Patient taking differently: Take 1,000 mg by mouth 2 (two) times daily as needed (cold sores/fever blisters.). )  Allergies  Allergen Reactions  . Penicillins Diarrhea    Stomach upset Did it involve swelling of the face/tongue/throat, SOB, or low BP? No Did it involve sudden or severe rash/hives, skin peeling, or any reaction on the inside of your mouth or nose? No Did you need to seek medical attention at a hospital or doctor's office? No When did it last happen? More than 20 years ago If all above answers are "NO", may proceed with cephalosporin use.       Review of Systems negative except from HPI and PMH  Physical Exam BP 106/70 (BP Location: Left Arm, Patient Position: Sitting, Cuff Size: Normal)   Pulse (!) 52   Ht 5\' 11"  (1.803 m)   Wt 191 lb 8 oz (86.9 kg)   SpO2 98%   BMI 26.71 kg/m  Well developed and well nourished in no acute distress HENT normal E scleral and icterus  clear Neck Supple JVP flat; carotids brisk and full Clear to ausculation  Regular rate and rhythm, no murmurs gallops or rub Soft with active bowel sounds No clubbing cyanosis  Edema Alert and oriented, grossly normal motor and sensory function Skin Warm and Dry  ECG sinus at 52 Intervals 20/11/43 Axis rightward to 46  CrCl cannot be calculated (Patient's most recent lab result is older than the maximum 21 days allowed.).   Assessment and  Plan  Cardiomyopathy-nonischemic  cMRI abnormal with full-thickness inferior scar question cause  PVCs left bundle indeterminate axis-fractionated  Congestive heart failure-chronic-systolic class II  Pectus excavatum-moderate-/severe   Right axis deviation  High Risk Medication Surveillance-amiodarone  The patient is tolerating amiodarone with suppression of his ventricular ectopy.  Our initial review at 2 months there is no change in LV function.  We will reassess it in about 2-3 more months.  Lengthy discussion related to trying to sort as to whether his PVCs are primarily or secondarily related to his cardiomyopathy.  In the event that there is no further improvement in LV function, and his MRI data would make this not unlikely, ongoing amiodarone and ICD for primary prevention would be the next questions.  Is tolerating the amiodarone thus far.  We will check surveillance laboratories.  Euvolemic continue current meds     Current medicines are reviewed at length with the patient today .  The patient does not  have concerns regarding medicines.

## 2019-08-19 LAB — HEPATIC FUNCTION PANEL
ALT: 26 IU/L (ref 0–44)
AST: 21 IU/L (ref 0–40)
Albumin: 4.3 g/dL (ref 3.8–4.9)
Alkaline Phosphatase: 67 IU/L (ref 48–121)
Bilirubin Total: 0.8 mg/dL (ref 0.0–1.2)
Bilirubin, Direct: 0.18 mg/dL (ref 0.00–0.40)
Total Protein: 6.6 g/dL (ref 6.0–8.5)

## 2019-08-19 LAB — TSH: TSH: 3.12 u[IU]/mL (ref 0.450–4.500)

## 2019-08-24 NOTE — Progress Notes (Deleted)
     Established patient visit   Patient: Daniel Benson   DOB: 02-Aug-1960   59 y.o. Male  MRN: [id number] Visit Date: 09/05/2019  Today's healthcare provider: [name], MD   No chief complaint on file.  Subjective    HPI  ***  {Show patient history (optional):23778::" "}   Medications: Outpatient Medications Prior to Visit  Medication Sig  . acetaminophen (TYLENOL) 500 MG tablet Take 500-1,000 mg by mouth every 6 (six) hours as needed (for pain.).  Marland Kitchen amiodarone (PACERONE) 200 MG tablet Take 1 tablet (200 mg total) by mouth daily.  Marland Kitchen aspirin EC 81 MG tablet Take 81 mg by mouth every evening.  Marland Kitchen atorvastatin (LIPITOR) 10 MG tablet TAKE 1 TABLET BY MOUTH EVERY DAY  . Biotin 1000 MCG tablet Take 1,000 mcg by mouth every evening.  . carvedilol (COREG) 3.125 MG tablet Take 1 tablet (3.125 mg total) by mouth 2 (two) times daily.  Marland Kitchen ENTRESTO 24-26 MG TAKE 1 TABLET BY MOUTH TWICE A DAY  . ibuprofen (ADVIL) 200 MG tablet Take 400 mg by mouth every 8 (eight) hours as needed (for pain.).  Marland Kitchen ketorolac (ACULAR) 0.5 % ophthalmic solution Place 1 drop into the right eye 4 (four) times daily.   . naproxen sodium (ALEVE) 220 MG tablet Take 220 mg by mouth 2 (two) times daily as needed (pain.).  Marland Kitchen prednisoLONE acetate (PRED FORTE) 1 % ophthalmic suspension Place 1 drop into the right eye every 2 (two) hours while awake.   . sildenafil (VIAGRA) 50 MG tablet Take 1 tablet (50 mg total) by mouth daily as needed for erectile dysfunction.  Marland Kitchen spironolactone (ALDACTONE) 25 MG tablet Take 1 tablet (25 mg) by mouth once daily at bedtime  . valACYclovir (VALTREX) 1000 MG tablet 2 pills every 12 hours for one day at onset of fever blister/cold sore (Patient taking differently: Take 1,000 mg by mouth 2 (two) times daily as needed (cold sores/fever blisters.). )   No facility-administered medications prior to visit.    Review of Systems  Constitutional: Negative for appetite change, chills and  fever.  Respiratory: Negative for chest tightness, shortness of breath and wheezing.   Cardiovascular: Negative for chest pain and palpitations.  Gastrointestinal: Negative for abdominal pain, nausea and vomiting.    {Heme  Chem  Endocrine  Serology  Results Review (optional):23779::" "}  Objective    There were no vitals taken for this visit. {Show previous vital signs (optional):23777::" "}  Physical Exam  ***  No results found for any visits on 09/05/19.  Assessment & Plan     ***  No follow-ups on file.      {provider attestation***:1}   [name], MD  [facility] [phone] (phone) [phone] (fax)  [place]

## 2019-09-05 ENCOUNTER — Ambulatory Visit: Payer: 59 | Admitting: Family Medicine

## 2019-09-08 ENCOUNTER — Telehealth: Payer: Self-pay | Admitting: Internal Medicine

## 2019-09-08 ENCOUNTER — Telehealth: Payer: Self-pay | Admitting: Cardiovascular Disease

## 2019-09-08 NOTE — Telephone Encounter (Signed)
Patient needs a new coupon for Daniel Benson this year .  Spoke with pam .    Patient aware he can pick up card at front desk .

## 2019-09-09 NOTE — Progress Notes (Deleted)
     Established patient visit   Patient: Daniel Benson   DOB: 04/18/1960   59 y.o. Male  MRN: [id number] Visit Date: 09/13/2019  Today's healthcare provider: [name], MD   No chief complaint on file.  Subjective    HPI  ***  {Show patient history (optional):23778::" "}   Medications: Outpatient Medications Prior to Visit  Medication Sig  . acetaminophen (TYLENOL) 500 MG tablet Take 500-1,000 mg by mouth every 6 (six) hours as needed (for pain.).  Marland Kitchen amiodarone (PACERONE) 200 MG tablet Take 1 tablet (200 mg total) by mouth daily.  Marland Kitchen aspirin EC 81 MG tablet Take 81 mg by mouth every evening.  Marland Kitchen atorvastatin (LIPITOR) 10 MG tablet TAKE 1 TABLET BY MOUTH EVERY DAY  . Biotin 1000 MCG tablet Take 1,000 mcg by mouth every evening.  . carvedilol (COREG) 3.125 MG tablet Take 1 tablet (3.125 mg total) by mouth 2 (two) times daily.  Marland Kitchen ENTRESTO 24-26 MG TAKE 1 TABLET BY MOUTH TWICE A DAY  . ibuprofen (ADVIL) 200 MG tablet Take 400 mg by mouth every 8 (eight) hours as needed (for pain.).  Marland Kitchen ketorolac (ACULAR) 0.5 % ophthalmic solution Place 1 drop into the right eye 4 (four) times daily.   . naproxen sodium (ALEVE) 220 MG tablet Take 220 mg by mouth 2 (two) times daily as needed (pain.).  Marland Kitchen prednisoLONE acetate (PRED FORTE) 1 % ophthalmic suspension Place 1 drop into the right eye every 2 (two) hours while awake.   . sildenafil (VIAGRA) 50 MG tablet Take 1 tablet (50 mg total) by mouth daily as needed for erectile dysfunction.  Marland Kitchen spironolactone (ALDACTONE) 25 MG tablet Take 1 tablet (25 mg) by mouth once daily at bedtime  . valACYclovir (VALTREX) 1000 MG tablet 2 pills every 12 hours for one day at onset of fever blister/cold sore (Patient taking differently: Take 1,000 mg by mouth 2 (two) times daily as needed (cold sores/fever blisters.). )   No facility-administered medications prior to visit.    Review of Systems  Constitutional: Negative for appetite change, chills and  fever.  Respiratory: Negative for chest tightness, shortness of breath and wheezing.   Cardiovascular: Negative for chest pain and palpitations.  Gastrointestinal: Negative for abdominal pain, nausea and vomiting.    {Heme  Chem  Endocrine  Serology  Results Review (optional):23779::" "}  Objective    There were no vitals taken for this visit. {Show previous vital signs (optional):23777::" "}  Physical Exam  ***  No results found for any visits on 09/13/19.  Assessment & Plan     ***  No follow-ups on file.      {provider attestation***:1}   [name], MD  [facility] [phone] (phone) [phone] (fax)  [place]

## 2019-09-13 ENCOUNTER — Ambulatory Visit: Payer: 59 | Admitting: Family Medicine

## 2019-09-13 NOTE — Progress Notes (Signed)
Trena Platt Nayzeth Altman,acting as a scribe for [name], MD.,have documented all relevant documentation on the behalf of [name], MD,as directed by  [name], MD while in the presence of [name], MD.  Established patient visit   Patient: Daniel Benson   DOB: 1960-07-18   59 y.o. Male  MRN: [id number] Visit Date: 09/15/2019  Today's healthcare provider: [name], MD   Chief Complaint  Patient presents with  . Chest Pain   Subjective    HPI  Patient presents today in office for a sore spot on his chest that he first noticed one month ago, patient says that I started out of no where. He said at first he though that he had lifted something and just bumped his chest but now he feels as that is not the case because it is still hurting. Patient says that the pain has not moved to any other area since it started. Patient does not have any other symptoms122.  No gynecomastia and no nipple discharge.  No axillary adenopathy and overall he feels well.     Medications: Outpatient Medications Prior to Visit  Medication Sig  . acetaminophen (TYLENOL) 500 MG tablet Take 500-1,000 mg by mouth every 6 (six) hours as needed (for pain.).  Marland Kitchen amiodarone (PACERONE) 200 MG tablet Take 1 tablet (200 mg total) by mouth daily.  Marland Kitchen aspirin EC 81 MG tablet Take 81 mg by mouth every evening.  Marland Kitchen atorvastatin (LIPITOR) 10 MG tablet TAKE 1 TABLET BY MOUTH EVERY DAY  . Biotin 1000 MCG tablet Take 1,000 mcg by mouth every evening.  . carvedilol (COREG) 3.125 MG tablet Take 1 tablet (3.125 mg total) by mouth 2 (two) times daily.  Marland Kitchen ENTRESTO 24-26 MG TAKE 1 TABLET BY MOUTH TWICE A DAY  . ibuprofen (ADVIL) 200 MG tablet Take 400 mg by mouth every 8 (eight) hours as needed (for pain.).  Marland Kitchen naproxen sodium (ALEVE) 220 MG tablet Take 220 mg by mouth 2 (two) times daily as needed (pain.).  Marland Kitchen sildenafil (VIAGRA) 50 MG tablet Take 1 tablet (50 mg total) by mouth daily as  needed for erectile dysfunction.  Marland Kitchen spironolactone (ALDACTONE) 25 MG tablet Take 1 tablet (25 mg) by mouth once daily at bedtime  . valACYclovir (VALTREX) 1000 MG tablet 2 pills every 12 hours for one day at onset of fever blister/cold sore (Patient taking differently: Take 1,000 mg by mouth 2 (two) times daily as needed (cold sores/fever blisters.). )  . ketorolac (ACULAR) 0.5 % ophthalmic solution Place 1 drop into the right eye 4 (four) times daily.  (Patient not taking: Reported on 09/15/2019)  . prednisoLONE acetate (PRED FORTE) 1 % ophthalmic suspension Place 1 drop into the right eye every 2 (two) hours while awake.  (Patient not taking: Reported on 09/15/2019)   No facility-administered medications prior to visit.    Review of Systems  Constitutional: Negative for appetite change, chills and fever.  Eyes: Negative.   Respiratory: Negative for chest tightness, shortness of breath and wheezing.   Cardiovascular: Negative for chest pain and palpitations.  Gastrointestinal: Negative for abdominal pain, nausea and vomiting.  Endocrine: Negative.   Allergic/Immunologic: Negative.   Neurological: Negative.   Psychiatric/Behavioral: Negative.        Objective    BP 122/80 (BP Location: Right Arm, Patient Position: Sitting, Cuff Size: Normal)   Pulse 66   Temp (!) 97.3 F (36.3 C) (Temporal)   Ht 5\' 11"  (  1.803 m)   Wt 194 lb 12.8 oz (88.4 kg)   BMI 27.17 kg/m  BP Readings from Last 3 Encounters:  09/15/19 122/80  08/18/19 106/70  05/19/19 112/78   Wt Readings from Last 3 Encounters:  09/15/19 194 lb 12.8 oz (88.4 kg)  08/18/19 191 lb 8 oz (86.9 kg)  05/19/19 201 lb 8 oz (91.4 kg)      Physical Exam Vitals reviewed.  Constitutional:      Appearance: Normal appearance.  HENT:     Head: Normocephalic and atraumatic.     Right Ear: External ear normal.     Left Ear: External ear normal.  Eyes:     General: No scleral icterus.    Conjunctiva/sclera: Conjunctivae normal.    Cardiovascular:     Rate and Rhythm: Normal rate and regular rhythm.     Pulses: Normal pulses.     Heart sounds: Normal heart sounds.  Pulmonary:     Effort: Pulmonary effort is normal.     Breath sounds: Normal breath sounds.  Chest:     Chest wall: No mass, tenderness or edema.     Comments: Normal examination of both breast and nipples, no discharge, no axillary adenopathy.  No supraclavicular adenopathy. Musculoskeletal:     Right lower leg: No edema.     Left lower leg: No edema.  Skin:    General: Skin is warm and dry.  Neurological:     General: No focal deficit present.     Mental Status: He is alert and oriented to person, place, and time.  Psychiatric:        Mood and Affect: Mood normal.        Behavior: Behavior normal.        Thought Content: Thought content normal.        Judgment: Judgment normal.       No results found for any visits on 09/15/19.  Assessment & Plan     1. Nipple pain Normal exam.  This could very well be a side effect from spironolactone or amiodarone.  No intervention presently.  2. Gynecomastia, male He actually has no gynecomastia, just nipple pain.  It is not tender today.  He has had no problems in recent days.  3. Dilated cardiomyopathy [facility]) Per Cardiology.  He is on amiodarone and spironolactone as noted above.  Other medications listed.  4. Frequent PVCs    No follow-ups on file.         Richard Cranford Mon, MD  [facility] [phone] (phone) [phone] (fax)  [place]

## 2019-09-15 ENCOUNTER — Encounter: Payer: Self-pay | Admitting: Family Medicine

## 2019-09-15 ENCOUNTER — Ambulatory Visit (INDEPENDENT_AMBULATORY_CARE_PROVIDER_SITE_OTHER): Payer: 59 | Admitting: Family Medicine

## 2019-09-15 ENCOUNTER — Other Ambulatory Visit: Payer: Self-pay

## 2019-09-15 VITALS — BP 122/80 | HR 66 | Temp 97.3°F | Ht 71.0 in | Wt 194.8 lb

## 2019-09-15 DIAGNOSIS — I420 Dilated cardiomyopathy: Secondary | ICD-10-CM

## 2019-09-15 DIAGNOSIS — I493 Ventricular premature depolarization: Secondary | ICD-10-CM

## 2019-09-15 DIAGNOSIS — N644 Mastodynia: Secondary | ICD-10-CM | POA: Diagnosis not present

## 2019-09-15 DIAGNOSIS — N62 Hypertrophy of breast: Secondary | ICD-10-CM

## 2019-09-26 NOTE — Progress Notes (Signed)
Complete physical exam   Patient: Daniel Benson   DOB: June 20, 1960   59 y.o. Male  MRN: [id number] Visit Date: 09/27/2019  Today's healthcare provider: [name], MD   Chief Complaint  Patient presents with  . Annual Exam   Subjective    Daniel Benson is a 59 y.o. male who presents today for a complete physical exam.  He reports consuming a general diet. The patient has a physically strenuous job, but has no regular exercise apart from work.  He generally feels well. He reports sleeping well. He does not have additional problems to discuss today.   Immunization History  Administered Date(s) Administered  . Influenza,inj,Quad PF,6+ Mos 12/14/2015, 12/28/2018  . Tdap 12/14/2015   10/15/2017 Colonoscopy, Dr Vicente Males   Past Medical History:  Diagnosis Date  . Chest pain   . ED (erectile dysfunction)   . Heartburn    OCCASIONAL  . Hx of dysplastic nevus 06/14/2003   multiple sites   . Pectus excavatum   . Precancerous lesion    H/O   Past Surgical History:  Procedure Laterality Date  . COLONOSCOPY WITH PROPOFOL N/A 10/15/2017   Procedure: COLONOSCOPY WITH PROPOFOL;  Surgeon: Jonathon Bellows, MD;  Location: [facility] ENDOSCOPY;  Service: Gastroenterology;  Laterality: N/A;  . HERNIA REPAIR     X 2  . INGUINAL HERNIA REPAIR Left   . LEFT HEART CATH AND CORONARY ANGIOGRAPHY Left 07/15/2018   Procedure: LEFT HEART CATH AND CORONARY ANGIOGRAPHY;  Surgeon: Minna Merritts, MD;  Location: [place] [facility];  Service: Cardiovascular;  Laterality: Left;   Social History   Socioeconomic History  . Marital status: Married    Spouse name: Not on file  . Number of children: Not on file  . Years of education: Not on file  . Highest education level: Not on file  Occupational History  . Occupation: Quarry manager: [employer]  Tobacco Use  . Smoking status: Former Smoker    Packs/day: 1.00    Years: 30.00    Pack years: 30.00    Types: Cigarettes    Quit date:  05/26/2006    Years since quitting: 13.3  . Smokeless tobacco: Never Used  Vaping Use  . Vaping Use: Never used  Substance and Sexual Activity  . Alcohol use: Yes    Alcohol/week: 2.0 standard drinks    Types: 2 Cans of beer per week    Comment: OCCASIONAL  . Drug use: No  . Sexual activity: Yes  Other Topics Concern  . Not on file  Social History Narrative  . Not on file   Social Determinants of Health   Financial Resource Strain:   . Difficulty of Paying Living Expenses:   Food Insecurity:   . Worried About Charity fundraiser in the Last Year:   . Arboriculturist in the Last Year:   Transportation Needs:   . Film/video editor (Medical):   Marland Kitchen Lack of Transportation (Non-Medical):   Physical Activity:   . Days of Exercise per Week:   . Minutes of Exercise per Session:   Stress:   . Feeling of Stress :   Social Connections:   . Frequency of Communication with Friends and Family:   . Frequency of Social Gatherings with Friends and Family:   . Attends Religious Services:   . Active Member of Clubs or Organizations:   . Attends Archivist Meetings:   Marland Kitchen Marital  Status:   Intimate Partner Violence:   . Fear of Current or Ex-Partner:   . Emotionally Abused:   Marland Kitchen Physically Abused:   . Sexually Abused:    Family Status  Relation Name Status  . Mother  Alive  . Father  Deceased  . Brother  Alive  . Brother  Deceased   Family History  Problem Relation Age of Onset  . Hypertension Mother   . Diabetes Mother   . Dementia Mother   . Hypertension Father   . Pulmonary fibrosis Father   . Leukemia Brother    Allergies  Allergen Reactions  . Penicillins Diarrhea    Stomach upset Did it involve swelling of the face/tongue/throat, SOB, or low BP? No Did it involve sudden or severe rash/hives, skin peeling, or any reaction on the inside of your mouth or nose? No Did you need to seek medical attention at a hospital or doctor's office? No When did it last  happen? More than 20 years ago If all above answers are "NO", may proceed with cephalosporin use.     Patient Care Team: [name], MD as PCP - General (Unknown Physician Specialty)   Medications: Outpatient Medications Prior to Visit  Medication Sig  . acetaminophen (TYLENOL) 500 MG tablet Take 500-1,000 mg by mouth every 6 (six) hours as needed (for pain.).  Marland Kitchen amiodarone (PACERONE) 200 MG tablet Take 1 tablet (200 mg total) by mouth daily.  Marland Kitchen aspirin EC 81 MG tablet Take 81 mg by mouth every evening.  Marland Kitchen atorvastatin (LIPITOR) 10 MG tablet TAKE 1 TABLET BY MOUTH EVERY DAY  . Biotin 1000 MCG tablet Take 1,000 mcg by mouth every evening.  . carvedilol (COREG) 3.125 MG tablet Take 1 tablet (3.125 mg total) by mouth 2 (two) times daily.  Marland Kitchen ENTRESTO 24-26 MG TAKE 1 TABLET BY MOUTH TWICE A DAY  . ibuprofen (ADVIL) 200 MG tablet Take 400 mg by mouth every 8 (eight) hours as needed (for pain.).  Marland Kitchen naproxen sodium (ALEVE) 220 MG tablet Take 220 mg by mouth 2 (two) times daily as needed (pain.).  Marland Kitchen sildenafil (VIAGRA) 50 MG tablet Take 1 tablet (50 mg total) by mouth daily as needed for erectile dysfunction.  Marland Kitchen spironolactone (ALDACTONE) 25 MG tablet Take 1 tablet (25 mg) by mouth once daily at bedtime  . valACYclovir (VALTREX) 1000 MG tablet 2 pills every 12 hours for one day at onset of fever blister/cold sore (Patient taking differently: Take 1,000 mg by mouth 2 (two) times daily as needed (cold sores/fever blisters.). )  . [DISCONTINUED] ketorolac (ACULAR) 0.5 % ophthalmic solution Place 1 drop into the right eye 4 (four) times daily.  (Patient not taking: Reported on 09/15/2019)  . [DISCONTINUED] prednisoLONE acetate (PRED FORTE) 1 % ophthalmic suspension Place 1 drop into the right eye every 2 (two) hours while awake.  (Patient not taking: Reported on 09/15/2019)   No facility-administered medications prior to visit.    Review of Systems  Constitutional: Negative.   HENT:  Negative.   Eyes: Negative.   Respiratory: Negative.   Cardiovascular: Negative.   Gastrointestinal: Negative.   Endocrine: Negative.   Genitourinary: Negative.   Musculoskeletal: Negative.   Skin: Negative.   Allergic/Immunologic: Negative.   Neurological: Negative.   Hematological: Negative.   Psychiatric/Behavioral: Negative.        Objective    BP 122/80 (BP Location: Right Arm, Patient Position: Sitting, Cuff Size: Normal)   Pulse (!) 52   Temp 97.6  F (36.4 C) (Oral)   Ht 5\' 11"  (1.803 m)   Wt 195 lb (88.5 kg)   BMI 27.20 kg/m     Physical Exam Constitutional:      Appearance: Normal appearance. He is normal weight.  HENT:     Head: Normocephalic and atraumatic.     Right Ear: Tympanic membrane, ear canal and external ear normal.     Left Ear: Tympanic membrane, ear canal and external ear normal.     Nose: Nose normal.     Mouth/Throat:     Mouth: Mucous membranes are moist.     Pharynx: Oropharynx is clear.  Eyes:     Extraocular Movements: Extraocular movements intact.     Conjunctiva/sclera: Conjunctivae normal.     Pupils: Pupils are equal, round, and reactive to light.  Cardiovascular:     Rate and Rhythm: Normal rate and regular rhythm.     Pulses: Normal pulses.     Heart sounds: Normal heart sounds.  Pulmonary:     Effort: Pulmonary effort is normal.     Breath sounds: Normal breath sounds.     Comments: Significant pectus excavatum. Abdominal:     General: Abdomen is flat. Bowel sounds are normal.     Palpations: Abdomen is soft.  Genitourinary:    Penis: Normal.      Testes: Normal.     Prostate: Normal.     Rectum: Normal.  Musculoskeletal:     Cervical back: Normal range of motion and neck supple.  Skin:    General: Skin is warm and dry.     Comments: Some AK's noted.  Neurological:     General: No focal deficit present.     Mental Status: He is alert and oriented to person, place, and time.  Psychiatric:        Mood and Affect:  Mood normal.        Behavior: Behavior normal.        Thought Content: Thought content normal.        Judgment: Judgment normal.      Home Exercise  09/27/2019  Current Exercise Habits The patient has a physically strenuous job, but has no regular exercise apart from work.   Functional Status Survey: Is the patient deaf or have difficulty hearing?: No Does the patient have difficulty seeing, even when wearing glasses/contacts?: No Does the patient have difficulty concentrating, remembering, or making decisions?: No Does the patient have difficulty walking or climbing stairs?: No Does the patient have difficulty dressing or bathing?: No Does the patient have difficulty doing errands alone such as visiting a doctor's office or shopping?: No  Last depression screening scores PHQ 2/9 Scores 09/27/2019 09/15/2017 12/14/2015  PHQ - 2 Score 0 0 0  PHQ- 9 Score 0 - -   Last fall risk screening Fall Risk  09/27/2019  Falls in the past year? 0  Number falls in past yr: 0  Injury with Fall? 0  Follow up -   Last Audit-C alcohol use screening Alcohol Use Disorder Test (AUDIT) 09/27/2019  1. How often do you have a drink containing alcohol? 3  2. How many drinks containing alcohol do you have on a typical day when you are drinking? 0  3. How often do you have six or more drinks on one occasion? 0  AUDIT-C Score 3  Alcohol Brief Interventions/Follow-up AUDIT Score <7 follow-up not indicated   A score of 3 or more in women, and 4 or more in  men indicates increased risk for alcohol abuse, EXCEPT if all of the points are from question 1   No results found for any visits on 09/27/19.  Assessment & Plan    Routine Health Maintenance and Physical Exam  Exercise Activities and Dietary recommendations Goals   None     Immunization History  Administered Date(s) Administered  . Influenza,inj,Quad PF,6+ Mos 12/14/2015, 12/28/2018  . Tdap 12/14/2015    Health Maintenance  Topic Date Due  .  Hepatitis C Screening  Never done  . COVID-19 Vaccine (1) Never done  . HIV Screening  Never done  . INFLUENZA VACCINE  09/25/2019  . COLONOSCOPY  10/15/2020  . TETANUS/TDAP  12/13/2025    Discussed health benefits of physical activity, and encouraged him to engage in regular exercise appropriate for his age and condition.    1. Annual physical exam Tubular adenomas October 15, 2017 by Dr. Vicente Males.  Assume repeat in 2024 - CBC with Differential/Platelet - Comprehensive metabolic panel - Lipid Panel With LDL/HDL Ratio - TSH  2. Prostate cancer screening  - PSA  3. Erectile dysfunction, unspecified erectile dysfunction type  - sildenafil (VIAGRA) 50 MG tablet; Take 1 tablet (50 mg total) by mouth daily as needed for erectile dysfunction.  Dispense: 10 tablet; Refill: 5 4.  Dilated cardiomyopathy Followed by Dr. Carmel [place], MD  [facility] [phone] (phone) [phone] (fax)  [place]

## 2019-09-27 ENCOUNTER — Other Ambulatory Visit: Payer: Self-pay

## 2019-09-27 ENCOUNTER — Ambulatory Visit (INDEPENDENT_AMBULATORY_CARE_PROVIDER_SITE_OTHER): Payer: 59 | Admitting: Family Medicine

## 2019-09-27 VITALS — BP 122/80 | HR 52 | Temp 97.6°F | Ht 71.0 in | Wt 195.0 lb

## 2019-09-27 DIAGNOSIS — Z125 Encounter for screening for malignant neoplasm of prostate: Secondary | ICD-10-CM | POA: Diagnosis not present

## 2019-09-27 DIAGNOSIS — I420 Dilated cardiomyopathy: Secondary | ICD-10-CM

## 2019-09-27 DIAGNOSIS — Z1211 Encounter for screening for malignant neoplasm of colon: Secondary | ICD-10-CM

## 2019-09-27 DIAGNOSIS — N529 Male erectile dysfunction, unspecified: Secondary | ICD-10-CM | POA: Diagnosis not present

## 2019-09-27 DIAGNOSIS — Z0000 Encounter for general adult medical examination without abnormal findings: Secondary | ICD-10-CM | POA: Diagnosis not present

## 2019-09-27 LAB — IFOBT (OCCULT BLOOD): IFOBT: NEGATIVE

## 2019-09-27 MED ORDER — SILDENAFIL CITRATE 50 MG PO TABS: 50.0000 mg | ORAL_TABLET | Freq: Every day | ORAL | 5 refills | Status: AC | PRN

## 2019-09-28 ENCOUNTER — Telehealth: Payer: Self-pay

## 2019-09-28 ENCOUNTER — Telehealth: Payer: Self-pay | Admitting: Internal Medicine

## 2019-09-28 LAB — LIPID PANEL WITH LDL/HDL RATIO
Cholesterol, Total: 165 mg/dL (ref 100–199)
HDL: 66 mg/dL (ref >39)
LDL Chol Calc (NIH): 82 mg/dL (ref 0–99)
LDL/HDL Ratio: 1.2 ratio (ref 0.0–3.6)
Triglycerides: 92 mg/dL (ref 0–149)
VLDL Cholesterol Cal: 17 mg/dL (ref 5–40)

## 2019-09-28 LAB — COMPREHENSIVE METABOLIC PANEL
ALT: 23 IU/L (ref 0–44)
AST: 19 IU/L (ref 0–40)
Albumin/Globulin Ratio: 1.9 (ref 1.2–2.2)
Albumin: 4.5 g/dL (ref 3.8–4.9)
Alkaline Phosphatase: 64 IU/L (ref 48–121)
BUN/Creatinine Ratio: 17 (ref 10–24)
BUN: 22 mg/dL (ref 8–27)
Bilirubin Total: 0.9 mg/dL (ref 0.0–1.2)
CO2: 24 mmol/L (ref 20–29)
Calcium: 9.2 mg/dL (ref 8.6–10.2)
Chloride: 100 mmol/L (ref 96–106)
Creatinine, Ser: 1.33 mg/dL — ABNORMAL HIGH (ref 0.76–1.27)
GFR calc Af Amer: 67 mL/min/{1.73_m2} (ref >59)
GFR calc non Af Amer: 58 mL/min/{1.73_m2} — ABNORMAL LOW (ref >59)
Globulin, Total: 2.4 g/dL (ref 1.5–4.5)
Glucose: 91 mg/dL (ref 65–99)
Potassium: 4.5 mmol/L (ref 3.5–5.2)
Sodium: 138 mmol/L (ref 134–144)
Total Protein: 6.9 g/dL (ref 6.0–8.5)

## 2019-09-28 LAB — CBC WITH DIFFERENTIAL/PLATELET
Basophils Absolute: 0.1 10*3/uL (ref 0.0–0.2)
Basos: 1 %
EOS (ABSOLUTE): 0.3 10*3/uL (ref 0.0–0.4)
Eos: 3 %
Hematocrit: 41.3 % (ref 37.5–51.0)
Hemoglobin: 14.3 g/dL (ref 13.0–17.7)
Immature Grans (Abs): 0 10*3/uL (ref 0.0–0.1)
Immature Granulocytes: 1 %
Lymphocytes Absolute: 2.1 10*3/uL (ref 0.7–3.1)
Lymphs: 25 %
MCH: 33 pg (ref 26.6–33.0)
MCHC: 34.6 g/dL (ref 31.5–35.7)
MCV: 95 fL (ref 79–97)
Monocytes Absolute: 0.6 10*3/uL (ref 0.1–0.9)
Monocytes: 7 %
Neutrophils Absolute: 5.3 10*3/uL (ref 1.4–7.0)
Neutrophils: 63 %
Platelets: 273 10*3/uL (ref 150–450)
RBC: 4.33 x10E6/uL (ref 4.14–5.80)
RDW: 12.5 % (ref 11.6–15.4)
WBC: 8.4 10*3/uL (ref 3.4–10.8)

## 2019-09-28 LAB — PSA: Prostate Specific Ag, Serum: 2.2 ng/mL (ref 0.0–4.0)

## 2019-09-28 LAB — TSH: TSH: 3.17 u[IU]/mL (ref 0.450–4.500)

## 2019-09-28 NOTE — Telephone Encounter (Signed)
Left voicemail message to call back  

## 2019-09-28 NOTE — Telephone Encounter (Signed)
LMTCB, P[facility] Triage Nurse may give patient results  

## 2019-09-28 NOTE — Telephone Encounter (Signed)
-----   Message from [name], MD sent at 09/28/2019  8:00 AM EDT ----- Labs in normal range.

## 2019-09-28 NOTE — Telephone Encounter (Signed)
Patient called and given results as noted by Dr. Rosanna Randy on 09/28/19, patient verbalized understanding.

## 2019-09-28 NOTE — Telephone Encounter (Signed)
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 Reviewed that provider is off this week but will return on Monday and can have him sign forms for denial and once that has been done I will give him a call back. He verbalized understanding of our conversation, agreement with plan, and had no further questions at this time.

## 2019-09-28 NOTE — Telephone Encounter (Signed)
Patient brought in DOT clearance forms to be completed. Patient would like to be called when they are complete so he can pick them up. Patients forms have been placed in the nurse box

## 2019-10-05 NOTE — Telephone Encounter (Addendum)
Left voicemail message to call back regarding DOT paperwork. Paperwork left on my desk pending call to discuss with patient.

## 2019-10-07 NOTE — Telephone Encounter (Signed)
Left voicemail message.

## 2019-10-10 NOTE — Telephone Encounter (Signed)
Patient returning call   States that during the day it is best to call [phone] but if you don't get him on that number ok to try [phone]

## 2019-10-11 NOTE — Telephone Encounter (Signed)
Patient came into office as we have been playing phone tag. Reviewed that we need to wait until he has his repeat Echo and appointment with Dr. Caryl Comes. If echo shows improvement he may be able to be cleared but if not then he can discuss information with provider at appointment the following day. He verbalized understanding with no further questions at this time.

## 2019-11-02 ENCOUNTER — Other Ambulatory Visit: Payer: Self-pay

## 2019-11-02 ENCOUNTER — Ambulatory Visit (INDEPENDENT_AMBULATORY_CARE_PROVIDER_SITE_OTHER): Payer: 59

## 2019-11-02 DIAGNOSIS — I493 Ventricular premature depolarization: Secondary | ICD-10-CM | POA: Diagnosis not present

## 2019-11-02 DIAGNOSIS — I428 Other cardiomyopathies: Secondary | ICD-10-CM

## 2019-11-02 LAB — ECHOCARDIOGRAM COMPLETE
AR max vel: 6.2 cm2
AV Area VTI: 5.59 cm2
AV Area mean vel: 6.06 cm2
AV Mean grad: 2 mmHg
AV Peak grad: 3.5 mmHg
Ao pk vel: 0.93 m/s
Area-P 1/2: 3.89 cm2
S' Lateral: 4.9 cm

## 2019-11-02 MED ORDER — PERFLUTREN LIPID MICROSPHERE
1.0000 mL | INTRAVENOUS | Status: AC | PRN
  Administered 2019-11-02: 2 mL via INTRAVENOUS

## 2019-11-03 ENCOUNTER — Ambulatory Visit (INDEPENDENT_AMBULATORY_CARE_PROVIDER_SITE_OTHER): Payer: 59 | Admitting: Internal Medicine

## 2019-11-03 ENCOUNTER — Encounter: Payer: Self-pay | Admitting: Internal Medicine

## 2019-11-03 VITALS — BP 120/70 | HR 46 | Ht 71.0 in | Wt 200.2 lb

## 2019-11-03 DIAGNOSIS — I493 Ventricular premature depolarization: Secondary | ICD-10-CM

## 2019-11-03 DIAGNOSIS — I428 Other cardiomyopathies: Secondary | ICD-10-CM

## 2019-11-03 DIAGNOSIS — Z79899 Other long term (current) drug therapy: Secondary | ICD-10-CM

## 2019-11-03 DIAGNOSIS — I5022 Chronic systolic (congestive) heart failure: Secondary | ICD-10-CM

## 2019-11-03 NOTE — Patient Instructions (Signed)
Medication Instructions:  - Your physician recommends that you continue on your current medications as directed. Please refer to the Current Medication list given to you today.  *If you need a refill on your cardiac medications before your next appointment, please call your pharmacy*   Lab Work: - none ordered  If you have labs (blood work) drawn today and your tests are completely normal, you will receive your results only by: Marland Kitchen MyChart Message (if you have MyChart) OR . A paper copy in the mail If you have any lab test that is abnormal or we need to change your treatment, we will call you to review the results.   Testing/Procedures: - Your physician has requested that you have an echocardiogram in 4 months. Echocardiography is a painless test that uses sound waves to create images of your heart. It provides your doctor with information about the size and shape of your heart and how well your heart's chambers and valves are working. This procedure takes approximately one hour. There are no restrictions for this procedure. There is a possibility that an IV may need to be started during your test to inject an image enhancing agent. This is done to obtain more optimal pictures of your heart. Therefore we ask that you do at least drink some water prior to coming in to hydrate your veins.   Note: if you saw up 10 minutes after your appointment time is scheduled to start, your test may need to be rescheduled.    Follow-Up: At [facility], you and your health needs are our priority.  As part of our continuing mission to provide you with exceptional heart care, we have created designated Provider Care Teams.  These Care Teams include your primary Cardiologist (physician) and Advanced Practice Providers (APPs -  Physician Assistants and Nurse Practitioners) who all work together to provide you with the care you need, when you need it.  We recommend signing up for the patient portal called  "MyChart".  Sign up information is provided on this After Visit Summary.  MyChart is used to connect with patients for Virtual Visits (Telemedicine).  Patients are able to view lab/test results, encounter notes, upcoming appointments, etc.  Non-urgent messages can be sent to your provider as well.   To learn more about what you can do with MyChart, go to [email or web address].    Your next appointment:   4 month(s) just after the echo is completed  The format for your next appointment:   In Person  Provider:   Virl Axe, MD   Other Instructions n/a

## 2019-11-03 NOTE — Progress Notes (Signed)
Patient Care Team: [name], MD as PCP - General (Unknown Physician Specialty)   HPI  Daniel Benson is a 59 y.o. male seen in followup for PVC and NICM with abnormal MRI DEMONSTRATING FULL-THICKNESS SCARRING  PVC fractionated.  Amiodarone initiated for suppression trial  3/21  Mild exercise limitations with dyspnea.  Some fatigue.  No edema.  Patient denies symptoms of GI intolerance, sun sensitivity, neurological symptoms attributable to amiodarone   Vaccinated     DATE TEST EF   5/20 LHC  30 % Cors- w/o significant obstruction  10/20 Echo  25-30% RV dysfn-mild; no RVE  3/21 cMRI 33% LGE-transmural inferior wall and RV attachment to LV   5/21 Echo 30%   9/21 Echo  30-35%     Date Cr K TSH LFTs Hgb  11/20   1.52( 11/19) 23   2/21 1.36 4.0   16.1   8/21 1.33 4.5 3.17 23 14.3   Date PVCs  3/21 27%  5/21  1%      Records and Results Reviewed   Past Medical History:  Diagnosis Date  . Chest pain   . ED (erectile dysfunction)   . Heartburn    OCCASIONAL  . Hx of dysplastic nevus 06/14/2003   multiple sites   . Pectus excavatum   . Precancerous lesion    H/O    Past Surgical History:  Procedure Laterality Date  . COLONOSCOPY WITH PROPOFOL N/A 10/15/2017   Procedure: COLONOSCOPY WITH PROPOFOL;  Surgeon: Jonathon Bellows, MD;  Location: [facility] ENDOSCOPY;  Service: Gastroenterology;  Laterality: N/A;  . HERNIA REPAIR     X 2  . INGUINAL HERNIA REPAIR Left   . LEFT HEART CATH AND CORONARY ANGIOGRAPHY Left 07/15/2018   Procedure: LEFT HEART CATH AND CORONARY ANGIOGRAPHY;  Surgeon: Minna Merritts, MD;  Location: [place] [facility];  Service: Cardiovascular;  Laterality: Left;    Current Meds  Medication Sig  . acetaminophen (TYLENOL) 500 MG tablet Take 500-1,000 mg by mouth every 6 (six) hours as needed (for pain.).  Marland Kitchen amiodarone (PACERONE) 200 MG tablet Take 1 tablet (200 mg total) by mouth daily.  Marland Kitchen aspirin EC 81 MG tablet Take 81  mg by mouth every evening.  Marland Kitchen atorvastatin (LIPITOR) 10 MG tablet TAKE 1 TABLET BY MOUTH EVERY DAY  . Biotin 1000 MCG tablet Take 1,000 mcg by mouth every evening.  . carvedilol (COREG) 3.125 MG tablet Take 1 tablet (3.125 mg total) by mouth 2 (two) times daily.  Marland Kitchen ENTRESTO 24-26 MG TAKE 1 TABLET BY MOUTH TWICE A DAY  . ibuprofen (ADVIL) 200 MG tablet Take 400 mg by mouth every 8 (eight) hours as needed (for pain.).  Marland Kitchen naproxen sodium (ALEVE) 220 MG tablet Take 220 mg by mouth 2 (two) times daily as needed (pain.).  Marland Kitchen sildenafil (VIAGRA) 50 MG tablet Take 1 tablet (50 mg total) by mouth daily as needed for erectile dysfunction.  Marland Kitchen spironolactone (ALDACTONE) 25 MG tablet Take 1 tablet (25 mg) by mouth once daily at bedtime  . valACYclovir (VALTREX) 1000 MG tablet 2 pills every 12 hours for one day at onset of fever blister/cold sore (Patient taking differently: Take 1,000 mg by mouth 2 (two) times daily as needed (cold sores/fever blisters.). )    Allergies  Allergen Reactions  . Penicillins Diarrhea    Stomach upset Did it involve swelling of the face/tongue/throat, SOB, or low BP? No Did it involve sudden or severe  rash/hives, skin peeling, or any reaction on the inside of your mouth or nose? No Did you need to seek medical attention at a hospital or doctor's office? No When did it last happen? More than 20 years ago If all above answers are "NO", may proceed with cephalosporin use.       Review of Systems negative except from HPI and PMH  Physical Exam BP 120/70 (BP Location: Left Arm, Patient Position: Sitting, Cuff Size: Normal)   Pulse (!) 46   Ht 5\' 11"  (1.803 m)   Wt 200 lb 4 oz (90.8 kg)   SpO2 98%   BMI 27.93 kg/m  Well developed and nourished in no acute distress HENT normal Neck supple with JVP-  flat   Clear Regular rate and rhythm, no murmurs or gallops Abd-soft with active BS No Clubbing cyanosis edema Skin-warm and dry A & Oriented  Grossly normal sensory  and motor function  ECG sinus at 46  CrCl cannot be calculated (Patient's most recent lab result is older than the maximum 21 days allowed.).   Assessment and  Plan  Cardiomyopathy-nonischemic  cMRI abnormal with full-thickness inferior scar question cause  PVCs left bundle indeterminate axis-fractionated  Congestive heart failure-chronic-systolic class II  Pectus excavatum-moderate-/severe   Right axis deviation  Sinus bradycardia  High Risk Medication Surveillance-amiodarone  PVC suppression has been successful with amiodarone.  Unfortunately, there has only been a modest improvement in LV systolic function.  Hence, we would have to interpret this as the PVCs be consequential to the cardiomyopathy although potentially aggravated it.  He would like to see if there is further improvement in his LV systolic function prior to making a decision regarding a defibrillator.  Hence, we will continue him on his current medications and reassess LV function in about 4 months.  At that time, we would plan to discontinue his amiodarone.  We will watch for rerecurrence of his PVCs and if they did, would recommend catheter ablation although a little bit less sanguine given the fractionation suggesting an intramyocardial focus  Sinus bradycardia may be contributing to some of his fatigue; however, as we will be making a decision in 4 months regarding his amiodarone, we will continue him on both medications.  Carvedilol is important for recovery of LV function.       Current medicines are reviewed at length with the patient today .  The patient does not  have concerns regarding medicines.

## 2019-11-14 ENCOUNTER — Encounter: Payer: Self-pay | Admitting: Family Medicine

## 2019-11-14 ENCOUNTER — Other Ambulatory Visit: Payer: Self-pay

## 2019-11-14 ENCOUNTER — Ambulatory Visit (INDEPENDENT_AMBULATORY_CARE_PROVIDER_SITE_OTHER): Payer: 59 | Admitting: Family Medicine

## 2019-11-14 VITALS — BP 120/73 | HR 56 | Temp 98.4°F | Wt 196.2 lb

## 2019-11-14 DIAGNOSIS — R42 Dizziness and giddiness: Secondary | ICD-10-CM | POA: Diagnosis not present

## 2019-11-14 DIAGNOSIS — I420 Dilated cardiomyopathy: Secondary | ICD-10-CM | POA: Diagnosis not present

## 2019-11-14 DIAGNOSIS — Z23 Encounter for immunization: Secondary | ICD-10-CM | POA: Diagnosis not present

## 2019-11-14 DIAGNOSIS — I493 Ventricular premature depolarization: Secondary | ICD-10-CM | POA: Diagnosis not present

## 2019-11-14 DIAGNOSIS — E782 Mixed hyperlipidemia: Secondary | ICD-10-CM

## 2019-11-14 MED ORDER — MECLIZINE HCL 25 MG PO TABS: 25.0000 mg | ORAL_TABLET | Freq: Three times a day (TID) | ORAL | 2 refills | Status: DC | PRN

## 2019-11-14 NOTE — Progress Notes (Signed)
Trena Platt Gypsy Kellogg,acting as a scribe for [name], MD.,have documented all relevant documentation on the behalf of [name], MD,as directed by  [name], MD while in the presence of [name], MD.  Established patient visit   Patient: Daniel Benson   DOB: 22-Jul-1960   59 y.o. Male  MRN: [id number] Visit Date: 11/14/2019  Today's healthcare provider: [name], MD   Chief Complaint  Patient presents with   Dizziness   Subjective      Dizziness This is a new problem. The current episode started yesterday. Associated symptoms include vertigo. Pertinent negatives include no abdominal pain, change in bowel habit, chest pain, chills, congestion, coughing, diaphoresis, fatigue, fever, headaches, joint swelling, myalgias, nausea, neck pain, numbness, rash, sore throat, urinary symptoms or visual change.    Patient says that the dizziness has started in the morning for the past 2 days.  No syncope or presyncope.  Felt like the room was spinning, now he just wants to move slowly so as not to aggravate the situation.  It is improving.  He now feels slightly woozy.     Medications: Outpatient Medications Prior to Visit  Medication Sig   acetaminophen (TYLENOL) 500 MG tablet Take 500-1,000 mg by mouth every 6 (six) hours as needed (for pain.).   amiodarone (PACERONE) 200 MG tablet Take 1 tablet (200 mg total) by mouth daily.   aspirin EC 81 MG tablet Take 81 mg by mouth every evening.   atorvastatin (LIPITOR) 10 MG tablet TAKE 1 TABLET BY MOUTH EVERY DAY   Biotin 1000 MCG tablet Take 1,000 mcg by mouth every evening.   carvedilol (COREG) 3.125 MG tablet Take 1 tablet (3.125 mg total) by mouth 2 (two) times daily.   ENTRESTO 24-26 MG TAKE 1 TABLET BY MOUTH TWICE A DAY   ibuprofen (ADVIL) 200 MG tablet Take 400 mg by mouth every 8 (eight) hours as needed (for pain.).   naproxen sodium (ALEVE) 220 MG tablet Take 220 mg by mouth 2  (two) times daily as needed (pain.).   sildenafil (VIAGRA) 50 MG tablet Take 1 tablet (50 mg total) by mouth daily as needed for erectile dysfunction.   spironolactone (ALDACTONE) 25 MG tablet Take 1 tablet (25 mg) by mouth once daily at bedtime   valACYclovir (VALTREX) 1000 MG tablet 2 pills every 12 hours for one day at onset of fever blister/cold sore (Patient taking differently: Take 1,000 mg by mouth 2 (two) times daily as needed (cold sores/fever blisters.). )   No facility-administered medications prior to visit.    Review of Systems  Constitutional: Negative for chills, diaphoresis, fatigue and fever.  HENT: Negative for congestion and sore throat.   Respiratory: Negative for cough.   Cardiovascular: Negative for chest pain.  Gastrointestinal: Negative for abdominal pain, change in bowel habit and nausea.  Musculoskeletal: Negative for joint swelling, myalgias and neck pain.  Skin: Negative for rash.  Neurological: Positive for dizziness and vertigo. Negative for numbness and headaches.       Objective    BP 120/73 (BP Location: Right Arm, Patient Position: Sitting, Cuff Size: Normal)    Pulse (!) 56    Temp 98.4 F (36.9 C) (Oral)    Wt 196 lb 3.2 oz (89 kg)    BMI 27.36 kg/m     Physical Exam Vitals reviewed.  Constitutional:      Appearance: Normal appearance.  HENT:     Head: Normocephalic and  atraumatic.     Right Ear: Tympanic membrane, ear canal and external ear normal.     Left Ear: Tympanic membrane, ear canal and external ear normal.     Nose: Nose normal.  Eyes:     General: No scleral icterus.    Extraocular Movements: Extraocular movements intact.     Conjunctiva/sclera: Conjunctivae normal.     Pupils: Pupils are equal, round, and reactive to light.  Cardiovascular:     Rate and Rhythm: Normal rate and regular rhythm.     Pulses: Normal pulses.     Heart sounds: Normal heart sounds.  Pulmonary:     Effort: Pulmonary effort is normal.     Breath  sounds: Normal breath sounds.  Abdominal:     General: Abdomen is flat. Bowel sounds are normal.     Palpations: Abdomen is soft.  Musculoskeletal:     Cervical back: Normal range of motion and neck supple.  Lymphadenopathy:     Cervical: No cervical adenopathy.  Skin:    General: Skin is warm and dry.  Neurological:     General: No focal deficit present.     Mental Status: He is alert and oriented to person, place, and time.     Comments: There is nystagmus with leftward gaze  Psychiatric:        Mood and Affect: Mood normal.        Behavior: Behavior normal.        Thought Content: Thought content normal.        Judgment: Judgment normal.       No results found for any visits on 11/14/19.  Assessment & Plan     1. Vertigo Push fluids and presently will treat with meclizine 25 mg every 8 hours as needed.  Out of work today and tomorrow.  May need ENT or neurology referral if this worsens. - meclizine (ANTIVERT) 25 MG tablet; Take 1 tablet (25 mg total) by mouth 3 (three) times daily as needed for dizziness.  Dispense: 30 tablet; Refill: 2  2. Needs flu shot  - Flu Vaccine QUAD 6+ mos PF IM (Fluarix Quad PF)  3. Dilated cardiomyopathy [facility]) Per cardiology  4. Frequent PVCs I do not think arrhythmia is the source of this dizziness. Cardiac exam is normal today.  5. Mixed hyperlipidemia    No follow-ups on file.     I, [name], MD, have reviewed all documentation for this visit. The documentation on 11/17/19 for the exam, diagnosis, procedures, and orders are all accurate and complete.    Richard Cranford Mon, MD  [facility] [phone] (phone) [phone] (fax)  [place]

## 2019-11-16 ENCOUNTER — Telehealth: Payer: Self-pay | Admitting: Internal Medicine

## 2019-11-16 NOTE — Telephone Encounter (Signed)
Darlene called stating she faxed over something in reference to a DOT form.  She is calling is find out if it was received.

## 2019-11-16 NOTE — Telephone Encounter (Signed)
Form received. Pulled for Dr. Caryl Comes to review.

## 2019-11-16 NOTE — Telephone Encounter (Signed)
I called and spoke with Darlene. I advised her that I that I had not received the patient's form here in [place]. She states she thinks she may have faxed to this to the [place] office.  I have asked her to resend this to me in [place] and have provided her with the [facility] fax #.   Will wait for the fax to arrive.

## 2019-11-16 NOTE — Telephone Encounter (Signed)
DOT forms were received, placed in nurse box

## 2019-11-17 ENCOUNTER — Ambulatory Visit: Payer: 59 | Admitting: Internal Medicine

## 2019-11-18 NOTE — Telephone Encounter (Signed)
DOT form for the patient was faxed back to [place] at [facility]. [phone]. Confirmation received.  Dr. Caryl Comes commented on the form that: "My understanding is that EF<35% is precluding other than that, I can't speak to his qualifications."  Form was faxed with a copy of Dr. Olin Pia most recent office note and the patient's most recent echo.

## 2019-11-30 ENCOUNTER — Other Ambulatory Visit: Payer: Self-pay | Admitting: Internal Medicine

## 2020-02-08 ENCOUNTER — Other Ambulatory Visit: Payer: Self-pay | Admitting: Family Medicine

## 2020-02-08 DIAGNOSIS — E782 Mixed hyperlipidemia: Secondary | ICD-10-CM

## 2020-02-24 IMAGING — CR DG SHOULDER 2+V*L*
1 series · 3 of 3 positions shown · non-contrast
Comparison: None.

CLINICAL DATA: Posterior shoulder pain for the past 5-6 months.
Decreased range of motion.

EXAM:
LEFT SHOULDER - 2+ VIEW

[Series 1: dg shoulder left · 0.14mm/px · 3 of 3 slices shown]
[im 1/3]
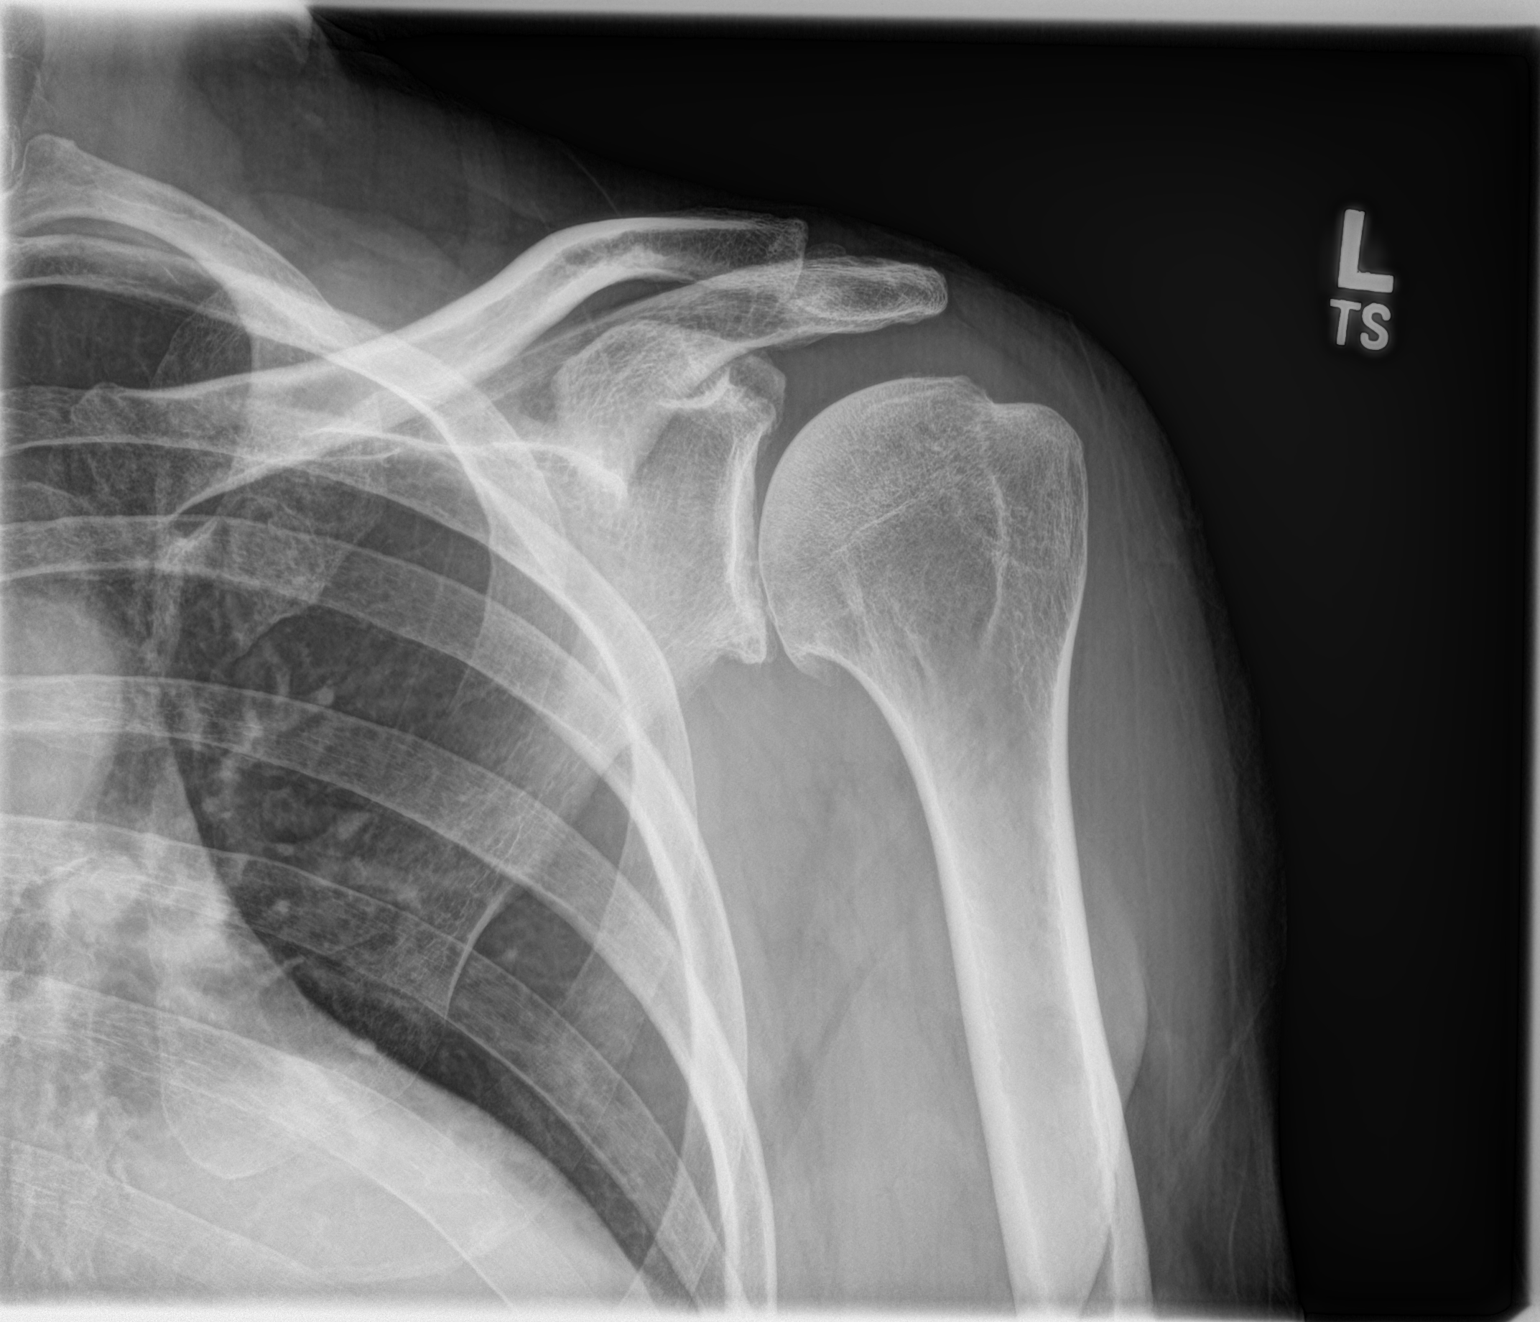
[im 2/3]
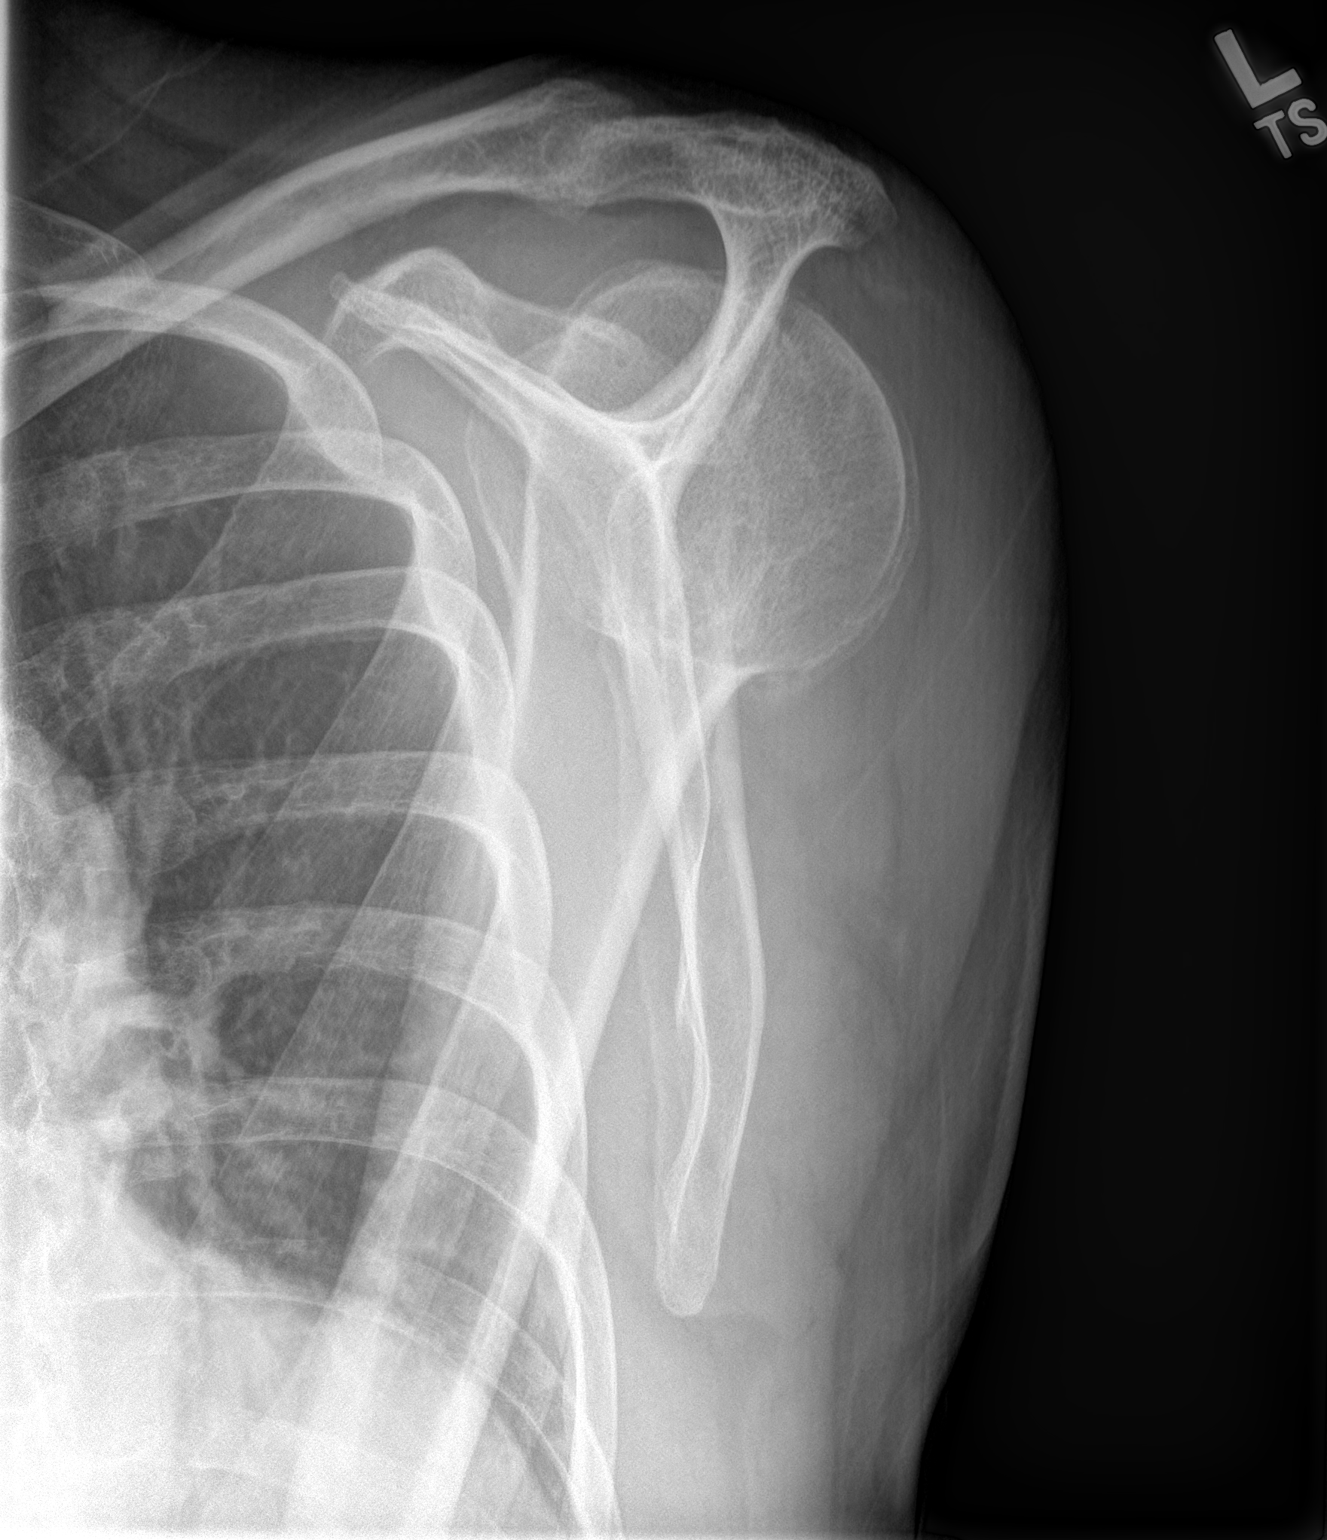
[im 3/3]
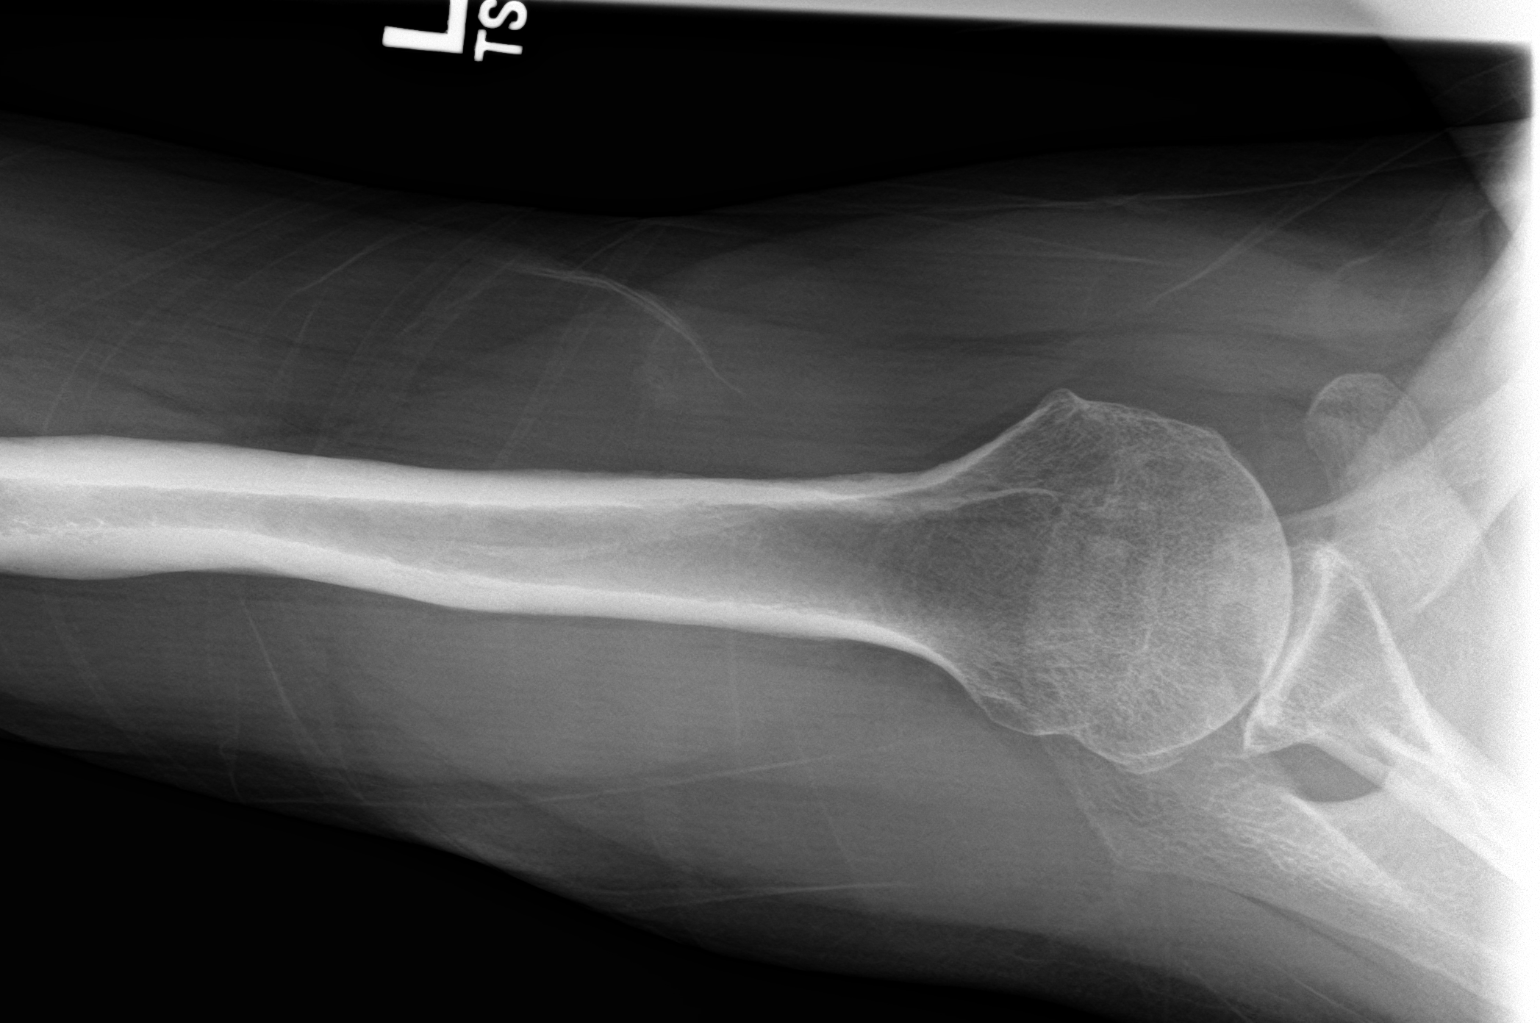

[3 of 3 positions shown; findings below may reference images not displayed]

FINDINGS: No fracture or dislocation.

Moderate degenerative change of the left glenohumeral joint with
joint space loss, articular surface irregularity, subchondral
sclerosis and inferiorly directed osteophytosis. No evidence of
calcific tendinitis.

Mild degenerative change the left AC joint with joint space loss and
inferiorly directed osteophytosis.

Limited visualization of the adjacent thorax is normal. Regional
soft tissues appear normal.
IMPRESSION: 1. Moderate degenerative change of the left glenohumeral joint.
2. Mild degenerative change the left AC joint.

## 2020-03-20 ENCOUNTER — Ambulatory Visit (INDEPENDENT_AMBULATORY_CARE_PROVIDER_SITE_OTHER): Payer: 59

## 2020-03-20 ENCOUNTER — Other Ambulatory Visit: Payer: Self-pay

## 2020-03-20 DIAGNOSIS — I5022 Chronic systolic (congestive) heart failure: Secondary | ICD-10-CM | POA: Diagnosis not present

## 2020-03-20 DIAGNOSIS — I428 Other cardiomyopathies: Secondary | ICD-10-CM

## 2020-03-20 MED ORDER — PERFLUTREN LIPID MICROSPHERE
1.0000 mL | INTRAVENOUS | Status: AC | PRN
  Administered 2020-03-20: 2 mL via INTRAVENOUS

## 2020-03-21 LAB — ECHOCARDIOGRAM COMPLETE
AR max vel: 2.9 cm2
AV Area VTI: 2.72 cm2
AV Area mean vel: 2.57 cm2
AV Mean grad: 3 mmHg
AV Peak grad: 4.7 mmHg
Ao pk vel: 1.08 m/s
Area-P 1/2: 1.74 cm2
S' Lateral: 4.4 cm

## 2020-03-23 NOTE — Telephone Encounter (Signed)
Closing encounter

## 2020-03-29 ENCOUNTER — Ambulatory Visit (INDEPENDENT_AMBULATORY_CARE_PROVIDER_SITE_OTHER): Payer: 59 | Admitting: Internal Medicine

## 2020-03-29 ENCOUNTER — Encounter: Payer: Self-pay | Admitting: Internal Medicine

## 2020-03-29 ENCOUNTER — Other Ambulatory Visit: Payer: Self-pay

## 2020-03-29 VITALS — BP 128/80 | HR 49 | Ht 71.0 in | Wt 202.0 lb

## 2020-03-29 DIAGNOSIS — I5022 Chronic systolic (congestive) heart failure: Secondary | ICD-10-CM

## 2020-03-29 DIAGNOSIS — I493 Ventricular premature depolarization: Secondary | ICD-10-CM | POA: Diagnosis not present

## 2020-03-29 DIAGNOSIS — R001 Bradycardia, unspecified: Secondary | ICD-10-CM

## 2020-03-29 DIAGNOSIS — I428 Other cardiomyopathies: Secondary | ICD-10-CM | POA: Diagnosis not present

## 2020-03-29 DIAGNOSIS — Z79899 Other long term (current) drug therapy: Secondary | ICD-10-CM

## 2020-03-29 MED ORDER — AMIODARONE HCL 100 MG PO TABS: 100.0000 mg | ORAL_TABLET | Freq: Every day | ORAL | 1 refills | Status: DC

## 2020-03-29 NOTE — Patient Instructions (Addendum)
Medication Instructions:  - Your physician has recommended you make the following change in your medication:   1) DECREASE amiodarone to 100 mg:  - take 1 tablet by mouth once daily  >> You may use up your amiodarone 200 mg tablets- take 0.5 tablet once daily until gone.  *If you need a refill on your cardiac medications before your next appointment, please call your pharmacy*   Lab Work: - Your physician recommends that you have for lab work today: CMET/ TSH  If you have labs (blood work) drawn today and your tests are completely normal, you will receive your results only by: Marland Kitchen MyChart Message (if you have MyChart) OR . A paper copy in the mail If you have any lab test that is abnormal or we need to change your treatment, we will call you to review the results.   Testing/Procedures: - Your physician has requested that you have an echocardiogram in 6 months (just prior to your follow up appointment with Dr. Caryl Comes). Echocardiography is a painless test that uses sound waves to create images of your heart. It provides your doctor with information about the size and shape of your heart and how well your heart's chambers and valves are working. This procedure takes approximately one hour. There are no restrictions for this procedure. There is a possibility that an IV may need to be started during your test to inject an image enhancing agent. This is done to obtain more optimal pictures of your heart. Therefore we ask that you do at least drink some water prior to coming in to hydrate your veins.     Follow-Up: At [facility], you and your health needs are our priority.  As part of our continuing mission to provide you with exceptional heart care, we have created designated Provider Care Teams.  These Care Teams include your primary Cardiologist (physician) and Advanced Practice Providers (APPs -  Physician Assistants and Nurse Practitioners) who all work together to provide you with the care  you need, when you need it.  We recommend signing up for the patient portal called "MyChart".  Sign up information is provided on this After Visit Summary.  MyChart is used to connect with patients for Virtual Visits (Telemedicine).  Patients are able to view lab/test results, encounter notes, upcoming appointments, etc.  Non-urgent messages can be sent to your provider as well.   To learn more about what you can do with MyChart, go to [email or web address].    Your next appointment:   6 month(s)  The format for your next appointment:   In Person  Provider:   Virl Axe, MD   Other Instructions n/a

## 2020-03-29 NOTE — Progress Notes (Signed)
Patient Care Team: [name], MD as PCP - General (Unknown Physician Specialty)   HPI  Daniel Benson is a 60 y.o. male seen in followup for PVC and NICM with abnormal MRI DEMONSTRATING FULL-THICKNESS SCARRING  PVC fractionated.  Amiodarone initiated for suppression trial  3/21  Patient denies symptoms of GI intolerance, sun sensitivity, neurological symptoms attributable to amiodarone.    The patient denies chest pain, shortness of breath, nocturnal dyspnea, orthopnea or peripheral edema.  There have been no palpitations, lightheadedness or syncope.   Now scheduled to retire in May       DATE TEST EF   5/20 LHC  30 % Cors- w/o significant obstruction  10/20 Echo  25-30% RV dysfn-mild; no RVE  3/21 cMRI 33% LGE-transmural inferior wall and RV attachment to LV   5/21 Echo 30%   9/21 Echo  30-35%   1/22 Echo 30-35%     Date Cr K TSH LFTs Hgb  11/20   1.52( 11/19) 23   2/21 1.36 4.0   16.1   8/21 1.33 4.5 3.17 23 14.3   Date PVCs  3/21 27%  5/21  1%      Records and Results Reviewed   Past Medical History:  Diagnosis Date  . Chest pain   . ED (erectile dysfunction)   . Heartburn    OCCASIONAL  . Hx of dysplastic nevus 06/14/2003   multiple sites   . Pectus excavatum   . Precancerous lesion    H/O    Past Surgical History:  Procedure Laterality Date  . COLONOSCOPY WITH PROPOFOL N/A 10/15/2017   Procedure: COLONOSCOPY WITH PROPOFOL;  Surgeon: Daniel Bellows, MD;  Location: [facility] ENDOSCOPY;  Service: Gastroenterology;  Laterality: N/A;  . HERNIA REPAIR     X 2  . INGUINAL HERNIA REPAIR Left   . LEFT HEART CATH AND CORONARY ANGIOGRAPHY Left 07/15/2018   Procedure: LEFT HEART CATH AND CORONARY ANGIOGRAPHY;  Surgeon: Minna Merritts, MD;  Location: [facility];  Service: Cardiovascular;  Laterality: Left;    Current Meds  Medication Sig  . acetaminophen (TYLENOL) 500 MG tablet Take 500-1,000 mg by mouth every 6 (six) hours  as needed (for pain.).  Marland Kitchen amiodarone (PACERONE) 200 MG tablet TAKE 1 TABLET BY MOUTH EVERY DAY  . aspirin EC 81 MG tablet Take 81 mg by mouth every evening.  Marland Kitchen atorvastatin (LIPITOR) 10 MG tablet TAKE 1 TABLET BY MOUTH EVERY DAY  . Biotin 1000 MCG tablet Take 1,000 mcg by mouth every evening.  Marland Kitchen ENTRESTO 24-26 MG TAKE 1 TABLET BY MOUTH TWICE A DAY  . ibuprofen (ADVIL) 200 MG tablet Take 400 mg by mouth every 8 (eight) hours as needed (for pain.).  Marland Kitchen meclizine (ANTIVERT) 25 MG tablet Take 1 tablet (25 mg total) by mouth 3 (three) times daily as needed for dizziness.  . naproxen sodium (ALEVE) 220 MG tablet Take 220 mg by mouth 2 (two) times daily as needed (pain.).  Marland Kitchen sildenafil (VIAGRA) 50 MG tablet Take 1 tablet (50 mg total) by mouth daily as needed for erectile dysfunction.  Marland Kitchen spironolactone (ALDACTONE) 25 MG tablet Take 1 tablet (25 mg) by mouth once daily at bedtime  . valACYclovir (VALTREX) 1000 MG tablet 2 pills every 12 hours for one day at onset of fever blister/cold sore (Patient taking differently: Take 1,000 mg by mouth 2 (two) times daily as needed (cold sores/fever blisters.).)    Allergies  Allergen Reactions  .  Penicillins Diarrhea    Stomach upset Did it involve swelling of the face/tongue/throat, SOB, or low BP? No Did it involve sudden or severe rash/hives, skin peeling, or any reaction on the inside of your mouth or nose? No Did you need to seek medical attention at a hospital or doctor's office? No When did it last happen? More than 20 years ago If all above answers are "NO", may proceed with cephalosporin use.       Review of Systems negative except from HPI and PMH  Physical Exam BP 128/80 (BP Location: Left Arm, Patient Position: Sitting, Cuff Size: Normal)   Pulse (!) 49   Ht 5\' 11"  (1.803 m)   Wt 202 lb (91.6 kg)   SpO2 95%   BMI 28.17 kg/m  Well developed and nourished in no acute distress HENT normal Neck supple with JVP-  flat  Clear Regular rate  and rhythm, no murmurs or gallops Abd-soft with active BS No Clubbing cyanosis edema Skin-warm and dry A & Oriented  Grossly normal sensory and motor function  ECG sinus at 49 if intervals 19/11/46 Inferior Q waves consistent with IMI Poor well R wave progression  CrCl cannot be calculated (Patient's most recent lab result is older than the maximum 21 days allowed.).   Assessment and  Plan  Cardiomyopathy-nonischemic  cMRI abnormal with full-thickness inferior scar question cause  PVCs left bundle indeterminate axis-fractionated  Congestive heart failure-chronic-systolic class II  Pectus excavatum-moderate-/severe   Right axis deviation  Sinus bradycardia  High Risk Medication Surveillance- Amiodarone   The patient has persistent left ventricular dysfunction with an EF of 30-35%. Decision making for ICDs is complicated by a paucity of data in patients who are medicated according to the new guidelines. This is so clearly the case that the MADIT study group has just embarked on a new trial for primary prevention in patients well medicated. Moreover, with his minimal symptoms and his borderline ejection fraction risk-benefit assessments are not nearly as positive for an ICD.  Hence, given his borderline reluctance, we have elected to postpone decision making at this time. I will reach out to Dr. Deidre Ala to consider the addition of an SGLT2 drug we will decrease his amiodarone from 200-100 mg a day. In the event that he had recurrent PVCs as we wean the drug off, consider catheter ablation as an alternative to long-term amiodarone with his young man.  This may also help mitigate some of his bradycardia. This does not seem to be symptomatic.       Current medicines are reviewed at length with the patient today .  The patient does not  have concerns regarding medicines.

## 2020-03-30 LAB — COMPREHENSIVE METABOLIC PANEL
ALT: 31 IU/L (ref 0–44)
AST: 29 IU/L (ref 0–40)
Albumin/Globulin Ratio: 1.9 (ref 1.2–2.2)
Albumin: 4.5 g/dL (ref 3.8–4.9)
Alkaline Phosphatase: 68 IU/L (ref 44–121)
BUN/Creatinine Ratio: 16 (ref 10–24)
BUN: 20 mg/dL (ref 8–27)
Bilirubin Total: 0.7 mg/dL (ref 0.0–1.2)
CO2: 24 mmol/L (ref 20–29)
Calcium: 9.2 mg/dL (ref 8.6–10.2)
Chloride: 100 mmol/L (ref 96–106)
Creatinine, Ser: 1.26 mg/dL (ref 0.76–1.27)
GFR calc Af Amer: 71 mL/min/{1.73_m2} (ref >59)
GFR calc non Af Amer: 62 mL/min/{1.73_m2} (ref >59)
Globulin, Total: 2.4 g/dL (ref 1.5–4.5)
Glucose: 94 mg/dL (ref 65–99)
Potassium: 4.8 mmol/L (ref 3.5–5.2)
Sodium: 136 mmol/L (ref 134–144)
Total Protein: 6.9 g/dL (ref 6.0–8.5)

## 2020-03-30 LAB — TSH: TSH: 2.97 u[IU]/mL (ref 0.450–4.500)

## 2020-04-03 ENCOUNTER — Telehealth: Payer: Self-pay | Admitting: Internal Medicine

## 2020-04-03 NOTE — Telephone Encounter (Signed)
Please call patient to discuss him getting a device (defibrillator) and disability. Patient states if he needs to come in to discuss, he is willing to do that. Patient would like to also discuss about a [facility] EP provider. States with his insurance he may have to change.

## 2020-04-04 ENCOUNTER — Telehealth: Payer: Self-pay

## 2020-04-04 MED ORDER — DAPAGLIFLOZIN PROPANEDIOL 10 MG PO TABS
10.0000 mg | ORAL_TABLET | Freq: Every day | ORAL | 3 refills | Status: DC
Start: 2020-04-04 — End: 2020-10-04

## 2020-04-04 NOTE — Telephone Encounter (Signed)
Was able to reach pt via phone, advised pt that Dr. Rockey Situ and Dr. Caryl Comes have consulted they wanted to start pt on "Farxiga 10 mg daily in the morning. This will help with his low ejection fraction and help to minimize symptoms of congestive heart failure" Pt agreeable with plan, mediation will be sent in to his pharmacy, will be available for pick up later today. Pt will also stop by later today or tomorrow to pick up a 2 week free sample and prescription savings card. Nothing further, pt understands new medications, all questions and concerns answer.   Free samples: Farxiga 10 mg tab (x2 boxes) 14 tabs total Lot # [id number] Exp: 02/2022

## 2020-04-05 ENCOUNTER — Telehealth: Payer: Self-pay | Admitting: Internal Medicine

## 2020-04-05 NOTE — Telephone Encounter (Signed)
  Patient Consent for Virtual Visit         Daniel Benson has provided verbal consent on 04/05/2020 for a virtual visit (video or telephone).   CONSENT FOR VIRTUAL VISIT FOR:  Daniel Benson  By participating in this virtual visit I agree to the following:  I hereby voluntarily request, consent and authorize [place] and its employed or contracted physicians, physician assistants, nurse practitioners or other licensed health care professionals (the Practitioner), to provide me with telemedicine health care services (the "Services") as deemed necessary by the treating Practitioner. I acknowledge and consent to receive the Services by the Practitioner via telemedicine. I understand that the telemedicine visit will involve communicating with the Practitioner through live audiovisual communication technology and the disclosure of certain medical information by electronic transmission. I acknowledge that I have been given the opportunity to request an in-person assessment or other available alternative prior to the telemedicine visit and am voluntarily participating in the telemedicine visit.  I understand that I have the right to withhold or withdraw my consent to the use of telemedicine in the course of my care at any time, without affecting my right to future care or treatment, and that the Practitioner or I may terminate the telemedicine visit at any time. I understand that I have the right to inspect all information obtained and/or recorded in the course of the telemedicine visit and may receive copies of available information for a reasonable fee.  I understand that some of the potential risks of receiving the Services via telemedicine include:  Marland Kitchen Delay or interruption in medical evaluation due to technological equipment failure or disruption; . Information transmitted may not be sufficient (e.g. poor resolution of images) to allow for appropriate medical decision making by the  Practitioner; and/or  . In rare instances, security protocols could fail, causing a breach of personal health information.  Furthermore, I acknowledge that it is my responsibility to provide information about my medical history, conditions and care that is complete and accurate to the best of my ability. I acknowledge that Practitioner's advice, recommendations, and/or decision may be based on factors not within their control, such as incomplete or inaccurate data provided by me or distortions of diagnostic images or specimens that may result from electronic transmissions. I understand that the practice of medicine is not an exact science and that Practitioner makes no warranties or guarantees regarding treatment outcomes. I acknowledge that a copy of this consent can be made available to me via my patient portal ([facility]), or I can request a printed copy by calling the office of [facility].    I understand that my insurance will be billed for this visit.   I have read or had this consent read to me. . I understand the contents of this consent, which adequately explains the benefits and risks of the Services being provided via telemedicine.  . I have been provided ample opportunity to ask questions regarding this consent and the Services and have had my questions answered to my satisfaction. . I give my informed consent for the services to be provided through the use of telemedicine in my medical care

## 2020-04-05 NOTE — Telephone Encounter (Signed)
Attempted to call the patient. He answered, but is driving home from work and we did not have good connection. He asked that I call him back in ~ 15 minutes. I advised I will call him back.

## 2020-04-05 NOTE — Telephone Encounter (Signed)
I called and spoke with the patient. He states that he spoke with Dr. Caryl Comes at his last visit about possibly retiring from [employer] in May 2022. He is running into the issue of, if that happens, she will be able to get insurance with the country, but it will be with [facility], which means he will need to switch to [facility] providers and he does not want to do that.   The patient states he has started working with an agent to help him decide what insurance plans may be best for him.  He could get some subsidies, but then he will not be able to use the co-pay card for his entresto.   He was told to ask about disability.   I advised the patient that per Dr. Olin Pia note, it seems as though he may be a borderline candidate for the ICD, however, I feel he would be best served by speaking with Dr. Caryl Comes directly.  I have offered him a virtual visit with Dr. Caryl Comes to discuss further and he is agreeable. He prefers a Monday or Tuesday.  I have scheduled him Tuesday 04/17/20 at 8:00 am for phone visit. The patient is advised to obtain BP/ HR/ Weight that morning and to expect a call from our [place] team ~ 15 minutes prior to the visit.   The patient voices understanding and is agreeable.   Consent obtained for visit.

## 2020-04-17 ENCOUNTER — Telehealth: Payer: 59 | Admitting: Internal Medicine

## 2020-05-14 ENCOUNTER — Other Ambulatory Visit: Payer: Self-pay | Admitting: Cardiovascular Disease

## 2020-05-28 ENCOUNTER — Other Ambulatory Visit: Payer: Self-pay

## 2020-05-28 ENCOUNTER — Encounter: Payer: Self-pay | Admitting: Dermatology

## 2020-05-28 ENCOUNTER — Ambulatory Visit (INDEPENDENT_AMBULATORY_CARE_PROVIDER_SITE_OTHER): Payer: 59 | Admitting: Dermatology

## 2020-05-28 DIAGNOSIS — L918 Other hypertrophic disorders of the skin: Secondary | ICD-10-CM | POA: Diagnosis not present

## 2020-05-28 DIAGNOSIS — L821 Other seborrheic keratosis: Secondary | ICD-10-CM

## 2020-05-28 DIAGNOSIS — D692 Other nonthrombocytopenic purpura: Secondary | ICD-10-CM

## 2020-05-28 DIAGNOSIS — L570 Actinic keratosis: Secondary | ICD-10-CM

## 2020-05-28 DIAGNOSIS — D1800 Hemangioma unspecified site: Secondary | ICD-10-CM

## 2020-05-28 DIAGNOSIS — D229 Melanocytic nevi, unspecified: Secondary | ICD-10-CM

## 2020-05-28 DIAGNOSIS — L578 Other skin changes due to chronic exposure to nonionizing radiation: Secondary | ICD-10-CM | POA: Diagnosis not present

## 2020-05-28 DIAGNOSIS — L814 Other melanin hyperpigmentation: Secondary | ICD-10-CM

## 2020-05-28 DIAGNOSIS — Z1283 Encounter for screening for malignant neoplasm of skin: Secondary | ICD-10-CM | POA: Diagnosis not present

## 2020-05-28 NOTE — Patient Instructions (Signed)
Cryotherapy Aftercare  . Wash gently with soap and water everyday.   Marland Kitchen Apply Vaseline and Band-Aid daily until healed.  Melanoma ABCDEs  Melanoma is the most dangerous type of skin cancer, and is the leading cause of death from skin disease.  You are more likely to develop melanoma if you:  Have light-colored skin, light-colored eyes, or red or blond hair  Spend a lot of time in the sun  Tan regularly, either outdoors or in a tanning bed  Have had blistering sunburns, especially during childhood  Have a close family member who has had a melanoma  Have atypical moles or large birthmarks  Early detection of melanoma is key since treatment is typically straightforward and cure rates are extremely high if we catch it early.   The first sign of melanoma is often a change in a mole or a new dark spot.  The ABCDE system is a way of remembering the signs of melanoma.  A for asymmetry:  The two halves do not match. B for border:  The edges of the growth are irregular. C for color:  A mixture of colors are present instead of an even brown color. D for diameter:  Melanomas are usually (but not always) greater than 58mm - the size of a pencil eraser. E for evolution:  The spot keeps changing in size, shape, and color.  Please check your skin once per month between visits. You can use a small mirror in front and a large mirror behind you to keep an eye on the back side or your body.   If you see any new or changing lesions before your next follow-up, please call to schedule a visit.  Please continue daily skin protection including broad spectrum sunscreen SPF 30+ to sun-exposed areas, reapplying every 2 hours as needed when you're outdoors.   Staying in the shade or wearing long sleeves, sun glasses (UVA+UVB protection) and wide brim hats (4-inch brim around the entire circumference of the hat) are also recommended for sun protection.

## 2020-05-28 NOTE — Progress Notes (Signed)
Follow-Up Visit   Subjective  Daniel Benson is a 60 y.o. male who presents for the following: tbse (Patient here today for tbse. He reports a spot under left ear he would like examined. Patient has no history of skin cancer. He has history of several dysplastic moles. ). Patient here for full body skin exam and skin cancer screening.  The following portions of the chart were reviewed this encounter and updated as appropriate:  Tobacco  Allergies  Meds  Problems  Med Hx  Surg Hx  Fam Hx      Objective  Well appearing patient in no apparent distress; mood and affect are within normal limits.  A full examination was performed including scalp, head, eyes, ears, nose, lips, neck, chest, axillae, abdomen, back, buttocks, bilateral upper extremities, bilateral lower extremities, hands, feet, fingers, toes, fingernails, and toenails. All findings within normal limits unless otherwise noted below.  Objective  Left Axilla: Fleshy, skin-colored pedunculated papules.    Objective  face Cherie Dark /neck x 15 (15): Erythematous thin papules/macules with gritty scale.   Assessment & Plan  Acrochordon Left Axilla Benign-appearing.  Observation.  Call clinic for new or changing moles.  Recommend daily use of broad spectrum spf 30+ sunscreen to sun-exposed areas.   Actinic keratosis (15) face Cherie Dark /neck x 15 Prior to procedure, discussed risks of blister formation, small wound, skin dyspigmentation, or rare scar following cryotherapy.   Destruction of lesion - face /ears /neck x 15 Complexity: simple   Destruction method: cryotherapy   Informed consent: discussed and consent obtained   Timeout:  patient name, date of birth, surgical site, and procedure verified Lesion destroyed using liquid nitrogen: Yes   Region frozen until ice ball extended beyond lesion: Yes   Outcome: patient tolerated procedure well with no complications   Post-procedure details: wound care instructions given     Skin cancer screening   Lentigines - Scattered tan macules - Due to sun exposure - Benign-appering, observe - Recommend daily broad spectrum sunscreen SPF 30+ to sun-exposed areas, reapply every 2 hours as needed. - Call for any changes  Seborrheic Keratoses - Stuck-on, waxy, tan-brown papules and/or plaques  - Benign-appearing - Discussed benign etiology and prognosis. - Observe - Call for any changes  Melanocytic Nevi - Tan-brown and/or pink-flesh-colored symmetric macules and papules - Benign appearing on exam today - Observation - Call clinic for new or changing moles - Recommend daily use of broad spectrum spf 30+ sunscreen to sun-exposed areas.   Purpura - Chronic; persistent and recurrent.  Treatable, but not curable. - Violaceous macules and patches on arms  - Benign - Related to trauma, age, sun damage and/or use of blood thinners, chronic use of topical and/or oral steroids - Observe - Can use OTC arnica containing moisturizer such as Dermend Bruise Formula if desired - Call for worsening or other concerns  Hemangiomas - Red papules - Discussed benign nature - Observe - Call for any changes  Actinic Damage - Chronic condition, secondary to cumulative UV/sun exposure - diffuse scaly erythematous macules with underlying dyspigmentation - Recommend daily broad spectrum sunscreen SPF 30+ to sun-exposed areas, reapply every 2 hours as needed.  - Staying in the shade or wearing long sleeves, sun glasses (UVA+UVB protection) and wide brim hats (4-inch brim around the entire circumference of the hat) are also recommended for sun protection.  - Call for new or changing lesions.  Skin cancer screening performed today.  Return in about 1 year (around 05/28/2021)  for tbse.  IRuthell Rummage, CMA, am acting as scribe for Sarina Ser, MD.  Documentation: I have reviewed the above documentation for accuracy and completeness, and I agree with the above.  Sarina Ser, MD

## 2020-05-29 ENCOUNTER — Encounter: Payer: Self-pay | Admitting: Dermatology

## 2020-06-24 ENCOUNTER — Other Ambulatory Visit: Payer: Self-pay | Admitting: Cardiovascular Disease

## 2020-07-02 ENCOUNTER — Telehealth: Payer: Self-pay | Admitting: Cardiovascular Disease

## 2020-07-02 NOTE — Telephone Encounter (Signed)
Spoke with pt about Daniel Benson, advised will need to register his $10 co-pay card each year for renewal, pt reports that was done earlier this year. Pt st he has "$800 left in spending for Entresto before having to pay full price". Pt reports this has not happen in the past, the $10 co-pay lasted an entire year, although pt reports on his past spending report the Daniel Benson was noted to not be deducted and he is wondering if it was not placed correctly in the past and now "the system fixed the error as to why I got to start paying full price in the next few months"    Advised pt to contact pharmacy to see what they can recommend and if they can give explanation of why the $10 co-pay will be defer d/t insurance, did offer PA for Entresto if he is not able to afford mediation out of pocket. Pt reports affording medication is not an issue, but "if I can get it at a cheaper rate then I should be accomodate". Verbalized understanding to pt, he will call back with any further questions or assistance with Entresto.

## 2020-07-02 NOTE — Telephone Encounter (Signed)
Patient calling  States they are trying to bill him full price for [facility] when he usually just pays $10 copay Would like to discuss what he would need to do to get it back to that  Please call

## 2020-07-13 ENCOUNTER — Other Ambulatory Visit: Payer: Self-pay | Admitting: Internal Medicine

## 2020-07-13 NOTE — Telephone Encounter (Signed)
Pt taking Carvedilol 3.125 mg tablet bid. Medication not listed on pt's last OV note but shown on current medication as expired. Please advise if ok to refill.

## 2020-07-13 NOTE — Telephone Encounter (Signed)
This is a [place] pt 

## 2020-07-17 NOTE — Telephone Encounter (Signed)
Could you please verify if patient is still taking Carvedilol, as it is showing up as an expired medication. Please clarify and sent to pharmacy on file per request.

## 2020-08-03 ENCOUNTER — Other Ambulatory Visit: Payer: Self-pay | Admitting: Family Medicine

## 2020-08-03 DIAGNOSIS — E782 Mixed hyperlipidemia: Secondary | ICD-10-CM

## 2020-09-17 ENCOUNTER — Other Ambulatory Visit: Payer: Self-pay | Admitting: Internal Medicine

## 2020-09-18 ENCOUNTER — Other Ambulatory Visit: Payer: Self-pay | Admitting: Internal Medicine

## 2020-09-20 ENCOUNTER — Telehealth (INDEPENDENT_AMBULATORY_CARE_PROVIDER_SITE_OTHER): Payer: Self-pay | Admitting: Gastroenterology

## 2020-09-20 DIAGNOSIS — Z86010 Personal history of colonic polyps: Secondary | ICD-10-CM

## 2020-09-20 MED ORDER — PEG 3350-KCL-NA BICARB-NACL 420 G PO SOLR: 4000.0000 mL | Freq: Once | ORAL | 0 refills | Status: AC

## 2020-09-20 NOTE — Progress Notes (Signed)
Gastroenterology Pre-Procedure Review  Request Date: 11/05/20 Requesting Physician: Dr. Vicente Males  PATIENT REVIEW QUESTIONS: The patient responded to the following health history questions as indicated:    1. Are you having any GI issues? no 2. Do you have a personal history of Polyps? yes (09/16/2017) 3. Do you have a family history of Colon Cancer or Polyps? no 4. Diabetes Mellitus? no 5. Joint replacements in the past 12 months?no 6. Major health problems in the past 3 months?no 7. Any artificial heart valves, MVP, or defibrillator?no    MEDICATIONS & ALLERGIES:    Patient reports the following regarding taking any anticoagulation/antiplatelet therapy:   Plavix, Coumadin, Eliquis, Xarelto, Lovenox, Pradaxa, Brilinta, or Effient? no Aspirin? yes (81 mg)  Patient confirms/reports the following medications:  Current Outpatient Medications  Medication Sig Dispense Refill   acetaminophen (TYLENOL) 500 MG tablet Take 500-1,000 mg by mouth every 6 (six) hours as needed (for pain.).     amiodarone (PACERONE) 100 MG tablet TAKE 1 TABLET BY MOUTH EVERY DAY 90 tablet 1   aspirin EC 81 MG tablet Take 81 mg by mouth every evening.     atorvastatin (LIPITOR) 10 MG tablet TAKE 1 TABLET BY MOUTH EVERY DAY 90 tablet 0   Biotin 1000 MCG tablet Take 1,000 mcg by mouth every evening.     carvedilol (COREG) 3.125 MG tablet TAKE 1 TABLET (3.125 MG TOTAL) BY MOUTH 2 (TWO) TIMES DAILY. 60 tablet 6   dapagliflozin propanediol (FARXIGA) 10 MG TABS tablet Take 1 tablet (10 mg total) by mouth daily before breakfast. 90 tablet 3   ENTRESTO 24-26 MG TAKE 1 TABLET BY MOUTH TWICE A DAY 60 tablet 5   ibuprofen (ADVIL) 200 MG tablet Take 400 mg by mouth every 8 (eight) hours as needed (for pain.).     meclizine (ANTIVERT) 25 MG tablet Take 1 tablet (25 mg total) by mouth 3 (three) times daily as needed for dizziness. 30 tablet 2   naproxen sodium (ALEVE) 220 MG tablet Take 220 mg by mouth 2 (two) times daily as needed  (pain.).     sildenafil (VIAGRA) 50 MG tablet Take 1 tablet (50 mg total) by mouth daily as needed for erectile dysfunction. 10 tablet 5   spironolactone (ALDACTONE) 25 MG tablet TAKE 1 TABLET BY MOUTH EVERY DAY 90 tablet 0   valACYclovir (VALTREX) 1000 MG tablet 2 pills every 12 hours for one day at onset of fever blister/cold sore (Patient taking differently: Take 1,000 mg by mouth 2 (two) times daily as needed (cold sores/fever blisters.).) 12 tablet 1   No current facility-administered medications for this visit.    Patient confirms/reports the following allergies:  Allergies  Allergen Reactions   Penicillins Diarrhea    Stomach upset Did it involve swelling of the face/tongue/throat, SOB, or low BP? No Did it involve sudden or severe rash/hives, skin peeling, or any reaction on the inside of your mouth or nose? No Did you need to seek medical attention at a hospital or doctor's office? No When did it last happen? More than 20 years ago If all above answers are "NO", may proceed with cephalosporin use.     No orders of the defined types were placed in this encounter.   AUTHORIZATION INFORMATION Primary Insurance: 1D#: Group #:  Secondary Insurance: 1D#: Group #:  SCHEDULE INFORMATION: Date: 11/05/20 Time: Location: [place]

## 2020-09-26 ENCOUNTER — Other Ambulatory Visit: Payer: Self-pay

## 2020-09-26 ENCOUNTER — Ambulatory Visit (INDEPENDENT_AMBULATORY_CARE_PROVIDER_SITE_OTHER): Payer: 59

## 2020-09-26 DIAGNOSIS — I428 Other cardiomyopathies: Secondary | ICD-10-CM

## 2020-09-26 DIAGNOSIS — I493 Ventricular premature depolarization: Secondary | ICD-10-CM | POA: Diagnosis not present

## 2020-09-26 DIAGNOSIS — I5022 Chronic systolic (congestive) heart failure: Secondary | ICD-10-CM

## 2020-09-26 LAB — ECHOCARDIOGRAM COMPLETE
AR max vel: 3.29 cm2
AV Area VTI: 3.3 cm2
AV Area mean vel: 3.34 cm2
AV Mean grad: 2 mmHg
AV Peak grad: 4 mmHg
Ao pk vel: 1 m/s
Area-P 1/2: 3.85 cm2
S' Lateral: 4.6 cm

## 2020-09-26 MED ORDER — PERFLUTREN LIPID MICROSPHERE
1.0000 mL | INTRAVENOUS | Status: AC | PRN
Start: 2020-09-26 — End: 2020-09-26
  Administered 2020-09-26: 2 mL via INTRAVENOUS

## 2020-10-01 ENCOUNTER — Encounter: Payer: Self-pay | Admitting: Family Medicine

## 2020-10-04 ENCOUNTER — Other Ambulatory Visit: Payer: Self-pay

## 2020-10-04 ENCOUNTER — Encounter: Payer: Self-pay | Admitting: Internal Medicine

## 2020-10-04 ENCOUNTER — Ambulatory Visit (INDEPENDENT_AMBULATORY_CARE_PROVIDER_SITE_OTHER): Payer: 59 | Admitting: Internal Medicine

## 2020-10-04 VITALS — BP 102/60 | HR 49 | Ht 71.0 in | Wt 191.0 lb

## 2020-10-04 DIAGNOSIS — Z79899 Other long term (current) drug therapy: Secondary | ICD-10-CM | POA: Diagnosis not present

## 2020-10-04 DIAGNOSIS — I420 Dilated cardiomyopathy: Secondary | ICD-10-CM

## 2020-10-04 NOTE — Progress Notes (Signed)
Patient ID: Daniel Benson, male   DOB: November 02, 1960, 60 y.o.   MRN: [id number]      Patient Care Team: [name], MD as PCP - General (Unknown Physician Specialty)   HPI  Daniel Benson is a 60 y.o. male seen in followup for PVC and NICM with abnormal MRI DEMONSTRATING FULL-THICKNESS SCARRING  PVC fractionated.  Amiodarone initiated for suppression trial  3/21 to good effect.  Ejection fraction has been borderline, the patient has been reluctant to receive an ICD and given the paucity of data in the era of new guideline directed medical therapy so we have chosen to wait  Retired in May, issues of insurance will be forthcoming at the end of the calendar year    Today, the patient denies shortness of breath, nocturnal dyspnea, orthopnea or peripheral edema.  There have been no palpitations or syncope.    Complains of lightheadedness and presyncope occurs while standing particularly when going from lying to standing.  Also has been having vertiginous symptoms provoked by turning of the head and repositioning his body. associated with sense of the world spinning, with out nausea and vomiting. Not Associated with palpitations.   Non radiating Chest pain lasted approximately 30 sec in duration , he felt it was an aching and stinging pain   He stay active by doing yard work and playing with grand kids   Patient denies symptoms of GI intolerance, sun sensitivity, neurological symptoms attributable to amiodarone.     Retired from [employer]  DATE TEST EF    5/20 LHC  30 % Cors- w/o significant obstruction  10/20 Echo  25-30% RV dysfn-mild; no RVE  3/21 cMRI 33% LGE-transmural inferior wall and RV attachment to LV   5/21 Echo 30%   9/21 Echo  30-35%   1/22 Echo 30-35%   08/22 Echo 30-35%       Date Cr K TSH LFTs Hgb  11/20   1.52( 11/19) 23   2/21 1.36 4.0   16.1   8/21 1.33 4.5 3.17 23 14.3  2/22 1.26 4.8 2.970      Date PVCs  3/21 27%  5/21   1%   Records  and Results Reviewed   Past Medical History:  Diagnosis Date   Chest pain    ED (erectile dysfunction)    Heartburn    OCCASIONAL   History of dysplastic nevus 08/24/2012   right lateral mid back/moderate, right ant lat mid thigh/mild   Hx of dysplastic nevus 07/22/2006   left UQA periumbilical/moderate   Pectus excavatum    Precancerous lesion    H/O    Past Surgical History:  Procedure Laterality Date   COLONOSCOPY WITH PROPOFOL N/A 10/15/2017   Procedure: COLONOSCOPY WITH PROPOFOL;  Surgeon: Jonathon Bellows, MD;  Location: [facility] ENDOSCOPY;  Service: Gastroenterology;  Laterality: N/A;   HERNIA REPAIR     X 2   INGUINAL HERNIA REPAIR Left    LEFT HEART CATH AND CORONARY ANGIOGRAPHY Left 07/15/2018   Procedure: LEFT HEART CATH AND CORONARY ANGIOGRAPHY;  Surgeon: Minna Merritts, MD;  Location: [facility];  Service: Cardiovascular;  Laterality: Left;    No outpatient medications have been marked as taking for the 10/04/20 encounter (Appointment) with Deboraha Sprang, MD.    Allergies  Allergen Reactions   Penicillins Diarrhea    Stomach upset Did it involve swelling of the face/tongue/throat, SOB, or low BP? No Did it involve sudden or severe rash/hives,  skin peeling, or any reaction on the inside of your mouth or nose? No Did you need to seek medical attention at a hospital or doctor's office? No When did it last happen? More than 20 years ago If all above answers are "NO", may proceed with cephalosporin use.     Review of Systems negative except from HPI and PMH  Physical Exam: BP 102/60 (BP Location: Left Arm, Patient Position: Sitting, Cuff Size: Normal)   Pulse (!) 49   Ht '5\' 11"'$  (1.803 m)   Wt 191 lb (86.6 kg)   SpO2 97%   BMI 26.64 kg/m  Well developed and nourished in no acute distress HENT normal Neck supple with JVP-  flat  Lungs Clear Regular rate and rhythm, no murmurs or gallops Abd-soft with active BS No Clubbing cyanosis No  edema Skin-warm and dry A & Oriented  Grossly normal sensory and motor function  ECG: Sinus at 49 Intervals 19/11/46 Inferior Q waves Right axis deviation Low voltage-worsening   CrCl cannot be calculated (Patient's most recent lab result is older than the maximum 21 days allowed.).   Assessment and  Plan  Cardiomyopathy-nonischemic   cMRI abnormal with full-thickness inferior scar question cause   PVCs left bundle indeterminate axis-fractionated   Congestive heart failure-chronic-systolic class II   Pectus excavatum-moderate-/severe   Right axis deviation/low voltage  Sinus bradycardia  Orthostatic LH/presyncope  Chest pain-atypical   High Risk Medication Surveillance- Amiodarone  The patient ejection fraction has remained unchanged.  This despite guideline directed therapy.  We addressed again the issue of the ICD and the paucity of data in patients medicated according to the new guidelines.  At this juncture we will again hold off on ICD implantation.  There has been interval worsening in his ECG with low voltage.  A quick literature review today failed to demonstrate that is an associated electrocardiographic abnormality with pectus.  There was a comment regarding effusion with pectus but his recent echo failed to demonstrate this.  We will follow this along.  Orthostatic presyncope in the context of his having started [place] will prompt this discontinuation.  In the event that his orthostatic symptoms persist, I would recommend at that point that we discontinue his Entresto and put him on losartan rather than leaving it at risk for falling down.  Tolerating amiodarone without evidence of PVCs.  We will plan in December to get a 3-day monitor to assess PVC burden and then make a decision regarding discontinuing the amiodarone.  For now we will continue to 100 mg a day.  Surveillance laboratories to be checked.  Sinus bradycardia remains asymptomatic  Chest pain  syndrome is atypical.   Reviewed the importance of learning the Epley maneuver for his vertigo.   Brooks Sailors as a scribe for Virl Axe, MD.,have documented all relevant documentation on the behalf of Virl Axe, MD,as directed by  Virl Axe, MD while in the presence of Virl Axe, MD.  I, Virl Axe, MD, have reviewed all documentation for this visit. The documentation on 10/04/20 for the exam, diagnosis, procedures, and orders are all accurate and complete.

## 2020-10-04 NOTE — Patient Instructions (Signed)
Medication Instructions:  Your physician has recommended you make the following change in your medication:   STOP Farxiga.  Continue your other medications as prescribed.  *If you need a refill on your cardiac medications before your next appointment, please call your pharmacy*   Lab Work: Tsh and Cmp today  If you have labs (blood work) drawn today and your tests are completely normal, you will receive your results only by: [place] (if you have MyChart) OR A paper copy in the mail If you have any lab test that is abnormal or we need to change your treatment, we will call you to review the results.   Testing/Procedures: None ordered   Follow-Up: At [facility], you and your health needs are our priority.  As part of our continuing mission to provide you with exceptional heart care, we have created designated Provider Care Teams.  These Care Teams include your primary Cardiologist (physician) and Advanced Practice Providers (APPs -  Physician Assistants and Nurse Practitioners) who all work together to provide you with the care you need, when you need it.  We recommend signing up for the patient portal called "MyChart".  Sign up information is provided on this After Visit Summary.  MyChart is used to connect with patients for Virtual Visits (Telemedicine).  Patients are able to view lab/test results, encounter notes, upcoming appointments, etc.  Non-urgent messages can be sent to your provider as well.   To learn more about what you can do with MyChart, go to [email or web address].    Your next appointment:   Early Dec 2022  The format for your next appointment:   In Person  Provider:   Virl Axe, MD   Other Instructions N/A

## 2020-10-05 LAB — COMPREHENSIVE METABOLIC PANEL
ALT: 18 IU/L (ref 0–44)
AST: 19 IU/L (ref 0–40)
Albumin/Globulin Ratio: 1.8 (ref 1.2–2.2)
Albumin: 4.2 g/dL (ref 3.8–4.8)
Alkaline Phosphatase: 62 IU/L (ref 44–121)
BUN/Creatinine Ratio: 12 (ref 10–24)
BUN: 14 mg/dL (ref 8–27)
Bilirubin Total: 1 mg/dL (ref 0.0–1.2)
CO2: 23 mmol/L (ref 20–29)
Calcium: 9.1 mg/dL (ref 8.6–10.2)
Chloride: 104 mmol/L (ref 96–106)
Creatinine, Ser: 1.17 mg/dL (ref 0.76–1.27)
Globulin, Total: 2.3 g/dL (ref 1.5–4.5)
Glucose: 83 mg/dL (ref 65–99)
Potassium: 4.1 mmol/L (ref 3.5–5.2)
Sodium: 140 mmol/L (ref 134–144)
Total Protein: 6.5 g/dL (ref 6.0–8.5)
eGFR: 71 mL/min/{1.73_m2} (ref >59)

## 2020-10-05 LAB — TSH: TSH: 2.93 u[IU]/mL (ref 0.450–4.500)

## 2020-10-25 ENCOUNTER — Other Ambulatory Visit: Payer: Self-pay

## 2020-10-25 ENCOUNTER — Encounter: Payer: Self-pay | Admitting: Family Medicine

## 2020-10-25 ENCOUNTER — Ambulatory Visit (INDEPENDENT_AMBULATORY_CARE_PROVIDER_SITE_OTHER): Payer: 59 | Admitting: Family Medicine

## 2020-10-25 VITALS — BP 114/67 | HR 59 | Temp 98.4°F | Resp 16 | Ht 71.0 in | Wt 194.0 lb

## 2020-10-25 DIAGNOSIS — I420 Dilated cardiomyopathy: Secondary | ICD-10-CM | POA: Diagnosis not present

## 2020-10-25 DIAGNOSIS — Z125 Encounter for screening for malignant neoplasm of prostate: Secondary | ICD-10-CM

## 2020-10-25 DIAGNOSIS — Z1389 Encounter for screening for other disorder: Secondary | ICD-10-CM

## 2020-10-25 DIAGNOSIS — Z0000 Encounter for general adult medical examination without abnormal findings: Secondary | ICD-10-CM

## 2020-10-25 DIAGNOSIS — E782 Mixed hyperlipidemia: Secondary | ICD-10-CM

## 2020-10-25 NOTE — Progress Notes (Signed)
I,[name],acting as a scribe for [name], MD.,have documented all relevant documentation on the behalf of [name], MD,as directed by  [name], MD while in the presence of [name], MD.  Complete physical exam   Patient: Daniel Benson   DOB: [birth year]   60 y.o. Male  MRN: [id number] Visit Date: 10/25/2020  Today's healthcare provider: [name], MD   Chief Complaint  Patient presents with   Annual Exam   Subjective    Daniel Benson is a 60 y.o. male who presents today for a complete physical exam.  He reports consuming a general diet. The patient has a physically strenuous job, but has no regular exercise apart from work.  He generally feels well. He reports sleeping fairly well. He does not have additional problems to discuss today.  Patient retired 2 months ago.  He does have occasional vertigo and some hemorrhoids that bother him.  He is scheduled to see GI next month . Patient states that he thinks he has some mild short-term memory loss. HPI    Past Medical History:  Diagnosis Date   Chest pain    ED (erectile dysfunction)    Heartburn    OCCASIONAL   History of dysplastic nevus 08/24/2012   right lateral mid back/moderate, right ant lat mid thigh/mild   Hx of dysplastic nevus 07/22/2006   left UQA periumbilical/moderate   Pectus excavatum    Precancerous lesion    H/O   Past Surgical History:  Procedure Laterality Date   COLONOSCOPY WITH PROPOFOL N/A 10/15/2017   Procedure: COLONOSCOPY WITH PROPOFOL;  Surgeon: Jonathon Bellows, MD;  Location: [facility] ENDOSCOPY;  Service: Gastroenterology;  Laterality: N/A;   HERNIA REPAIR     X 2   INGUINAL HERNIA REPAIR Left    LEFT HEART CATH AND CORONARY ANGIOGRAPHY Left 07/15/2018   Procedure: LEFT HEART CATH AND CORONARY ANGIOGRAPHY;  Surgeon: Minna Merritts, MD;  Location: [place] [facility];  Service: Cardiovascular;  Laterality: Left;   Social History    Socioeconomic History   Marital status: Married    Spouse name: Not on file   Number of children: Not on file   Years of education: Not on file   Highest education level: Not on file  Occupational History   Occupation: LINEMAN    Employer: [employer]  Tobacco Use   Smoking status: Former    Packs/day: 1.00    Years: 30.00    Pack years: 30.00    Types: Cigarettes    Quit date: 05/26/2006    Years since quitting: 14.4   Smokeless tobacco: Never  Vaping Use   Vaping Use: Never used  Substance and Sexual Activity   Alcohol use: Yes    Alcohol/week: 2.0 standard drinks    Types: 2 Cans of beer per week    Comment: OCCASIONAL   Drug use: No   Sexual activity: Yes  Other Topics Concern   Not on file  Social History Narrative   Not on file   Social Determinants of Health   Financial Resource Strain: Not on file  Food Insecurity: Not on file  Transportation Needs: Not on file  Physical Activity: Not on file  Stress: Not on file  Social Connections: Not on file  Intimate Partner Violence: Not on file   Family Status  Relation Name Status   Mother  Alive   Father  Deceased   Brother  Alive  Brother  Deceased   Family History  Problem Relation Age of Onset   Hypertension Mother    Diabetes Mother    Dementia Mother    Hypertension Father    Pulmonary fibrosis Father    Leukemia Brother    Allergies  Allergen Reactions   Penicillins Diarrhea    Stomach upset Did it involve swelling of the face/tongue/throat, SOB, or low BP? No Did it involve sudden or severe rash/hives, skin peeling, or any reaction on the inside of your mouth or nose? No Did you need to seek medical attention at a hospital or doctor's office? No When did it last happen? More than 20 years ago If all above answers are "NO", may proceed with cephalosporin use.     Patient Care Team: [name], MD as PCP - General (Unknown Physician Specialty)   Medications: Outpatient  Medications Prior to Visit  Medication Sig   acetaminophen (TYLENOL) 500 MG tablet Take 500-1,000 mg by mouth every 6 (six) hours as needed (for pain.).   amiodarone (PACERONE) 100 MG tablet TAKE 1 TABLET BY MOUTH EVERY DAY   aspirin EC 81 MG tablet Take 81 mg by mouth every evening.   atorvastatin (LIPITOR) 10 MG tablet TAKE 1 TABLET BY MOUTH EVERY DAY   Biotin 1000 MCG tablet Take 1,000 mcg by mouth every evening.   ENTRESTO 24-26 MG TAKE 1 TABLET BY MOUTH TWICE A DAY   ibuprofen (ADVIL) 200 MG tablet Take 400 mg by mouth every 8 (eight) hours as needed (for pain.).   meclizine (ANTIVERT) 25 MG tablet Take 1 tablet (25 mg total) by mouth 3 (three) times daily as needed for dizziness.   naproxen sodium (ALEVE) 220 MG tablet Take 220 mg by mouth 2 (two) times daily as needed (pain.).   sildenafil (VIAGRA) 50 MG tablet Take 1 tablet (50 mg total) by mouth daily as needed for erectile dysfunction.   spironolactone (ALDACTONE) 25 MG tablet TAKE 1 TABLET BY MOUTH EVERY DAY   valACYclovir (VALTREX) 1000 MG tablet 2 pills every 12 hours for one day at onset of fever blister/cold sore (Patient taking differently: Take 1,000 mg by mouth 2 (two) times daily as needed (cold sores/fever blisters.).)   carvedilol (COREG) 3.125 MG tablet TAKE 1 TABLET (3.125 MG TOTAL) BY MOUTH 2 (TWO) TIMES DAILY.   No facility-administered medications prior to visit.    Review of Systems  Neurological:  Positive for dizziness and light-headedness.  All other systems reviewed and are negative.     Objective    BP 114/67 (BP Location: Left Arm, Patient Position: Sitting, Cuff Size: Large)   Pulse (!) 59   Temp 98.4 F (36.9 C) (Temporal)   Resp 16   Ht '5\' 11"'$  (1.803 m)   Wt 194 lb (88 kg)   SpO2 96%   BMI 27.06 kg/m  BP Readings from Last 3 Encounters:  10/25/20 114/67  10/04/20 102/60  03/29/20 128/80   Wt Readings from Last 3 Encounters:  10/25/20 194 lb (88 kg)  10/04/20 191 lb (86.6 kg)  03/29/20  202 lb (91.6 kg)      Physical Exam Vitals reviewed.  Constitutional:      Appearance: Normal appearance. He is normal weight.  HENT:     Head: Normocephalic and atraumatic.     Right Ear: Tympanic membrane, ear canal and external ear normal.     Left Ear: Tympanic membrane, ear canal and external ear normal.     Nose:  Nose normal.     Mouth/Throat:     Mouth: Mucous membranes are moist.     Pharynx: Oropharynx is clear.  Eyes:     Extraocular Movements: Extraocular movements intact.     Conjunctiva/sclera: Conjunctivae normal.     Pupils: Pupils are equal, round, and reactive to light.  Cardiovascular:     Rate and Rhythm: Normal rate and regular rhythm.     Pulses: Normal pulses.     Heart sounds: Normal heart sounds.  Pulmonary:     Effort: Pulmonary effort is normal.     Breath sounds: Normal breath sounds.     Comments: Significant pectus excavatum. Abdominal:     General: Abdomen is flat. Bowel sounds are normal.     Palpations: Abdomen is soft.  Genitourinary:    Penis: Normal.      Testes: Normal.     Prostate: Normal.     Rectum: Normal.  Musculoskeletal:     Cervical back: Neck supple.  Skin:    General: Skin is warm and dry.     Comments: Some AK's noted.  Neurological:     General: No focal deficit present.     Mental Status: He is alert and oriented to person, place, and time.  Psychiatric:        Mood and Affect: Mood normal.        Behavior: Behavior normal.        Thought Content: Thought content normal.        Judgment: Judgment normal.      Last depression screening scores PHQ 2/9 Scores 10/25/2020 09/27/2019 09/15/2017  PHQ - 2 Score 0 0 0  PHQ- 9 Score 0 0 -   Last fall risk screening Fall Risk  10/25/2020  Falls in the past year? 0  Number falls in past yr: 0  Injury with Fall? 0  Risk for fall due to : No Fall Risks  Follow up Falls evaluation completed   Last Audit-C alcohol use screening Alcohol Use Disorder Test (AUDIT) 10/25/2020  1.  How often do you have a drink containing alcohol? 3  2. How many drinks containing alcohol do you have on a typical day when you are drinking? 0  3. How often do you have six or more drinks on one occasion? 1  AUDIT-C Score 4  4. How often during the last year have you found that you were not able to stop drinking once you had started? 0  5. How often during the last year have you failed to do what was normally expected from you because of drinking? 0  6. How often during the last year have you needed a first drink in the morning to get yourself going after a heavy drinking session? 0  7. How often during the last year have you had a feeling of guilt of remorse after drinking? 0  8. How often during the last year have you been unable to remember what happened the night before because you had been drinking? 0  9. Have you or someone else been injured as a result of your drinking? 0  10. Has a relative or friend or a doctor or another health worker been concerned about your drinking or suggested you cut down? 0  Alcohol Use Disorder Identification Test Final Score (AUDIT) 4  Alcohol Brief Interventions/Follow-up -   A score of 3 or more in women, and 4 or more in men indicates increased risk for alcohol abuse, EXCEPT if  all of the points are from question 1   MMSE - [place] Exam 10/25/2020  Orientation to time 5  Orientation to Place 5  Registration 3  Attention/ Calculation 5  Recall 3  Language- name 2 objects 2  Language- repeat 1  Language- follow 3 step command 3  Language- read & follow direction 1  Write a sentence 1  Copy design 1  Total score 30     No results found for any visits on 10/25/20.  Assessment & Plan    Routine Health Maintenance and Physical Exam  Exercise Activities and Dietary recommendations  Goals   None     Immunization History  Administered Date(s) Administered   Influenza,inj,Quad PF,6+ Mos 12/14/2015, 12/28/2018, 11/14/2019   Tdap  12/14/2015    Health Maintenance  Topic Date Due   COVID-19 Vaccine (1) Never done   Pneumococcal Vaccine 71-83 Years old (1 - PCV) Never done   HIV Screening  Never done   Hepatitis C Screening  Never done   Zoster Vaccines- Shingrix (1 of 2) Never done   INFLUENZA VACCINE  09/24/2020   COLONOSCOPY (Pts 45-1yr Insurance coverage will need to be confirmed)  10/15/2020   TETANUS/TDAP  12/13/2025   HPV VACCINES  Aged Out    Discussed health benefits of physical activity, and encouraged him to engage in regular exercise appropriate for his age and condition.  1. Annual physical exam  - Lipid panel - TSH - CBC w/Diff/Platelet - Comprehensive Metabolic Panel (CMET)  2. Mixed hyperlipidemia On atorvastatin - Lipid panel - TSH - CBC w/Diff/Platelet - Comprehensive Metabolic Panel (CMET)  3. Prostate cancer screening  - PSA  4. Dilated cardiomyopathy [place] by cardiology.   No follow-ups on file.     I, RWilhemena Durie MD, have reviewed all documentation for this visit. The documentation on 10/28/20 for the exam, diagnosis, procedures, and orders are all accurate and complete.    Jaccob Czaplicki GCranford Mon MD  [phone](phone) [phone](fax)  [place]

## 2020-10-31 ENCOUNTER — Other Ambulatory Visit: Payer: Self-pay | Admitting: Family Medicine

## 2020-10-31 DIAGNOSIS — E782 Mixed hyperlipidemia: Secondary | ICD-10-CM

## 2020-11-02 ENCOUNTER — Encounter: Payer: Self-pay | Admitting: Gastroenterology

## 2020-11-02 LAB — COMPREHENSIVE METABOLIC PANEL
ALT: 22 IU/L (ref 0–44)
AST: 20 IU/L (ref 0–40)
Albumin/Globulin Ratio: 2 (ref 1.2–2.2)
Albumin: 4.4 g/dL (ref 3.8–4.8)
Alkaline Phosphatase: 70 IU/L (ref 44–121)
BUN/Creatinine Ratio: 12 (ref 10–24)
BUN: 15 mg/dL (ref 8–27)
Bilirubin Total: 0.6 mg/dL (ref 0.0–1.2)
CO2: 26 mmol/L (ref 20–29)
Calcium: 9.7 mg/dL (ref 8.6–10.2)
Chloride: 102 mmol/L (ref 96–106)
Creatinine, Ser: 1.21 mg/dL (ref 0.76–1.27)
Globulin, Total: 2.2 g/dL (ref 1.5–4.5)
Glucose: 102 mg/dL — ABNORMAL HIGH (ref 65–99)
Potassium: 4.7 mmol/L (ref 3.5–5.2)
Sodium: 141 mmol/L (ref 134–144)
Total Protein: 6.6 g/dL (ref 6.0–8.5)
eGFR: 68 mL/min/{1.73_m2} (ref >59)

## 2020-11-02 LAB — CBC WITH DIFFERENTIAL/PLATELET
Basophils Absolute: 0.1 10*3/uL (ref 0.0–0.2)
Basos: 1 %
EOS (ABSOLUTE): 0.2 10*3/uL (ref 0.0–0.4)
Eos: 3 %
Hematocrit: 43.9 % (ref 37.5–51.0)
Hemoglobin: 15 g/dL (ref 13.0–17.7)
Immature Grans (Abs): 0 10*3/uL (ref 0.0–0.1)
Immature Granulocytes: 0 %
Lymphocytes Absolute: 1.9 10*3/uL (ref 0.7–3.1)
Lymphs: 26 %
MCH: 32.3 pg (ref 26.6–33.0)
MCHC: 34.2 g/dL (ref 31.5–35.7)
MCV: 95 fL (ref 79–97)
Monocytes Absolute: 0.6 10*3/uL (ref 0.1–0.9)
Monocytes: 7 %
Neutrophils Absolute: 4.6 10*3/uL (ref 1.4–7.0)
Neutrophils: 63 %
Platelets: 269 10*3/uL (ref 150–450)
RBC: 4.64 x10E6/uL (ref 4.14–5.80)
RDW: 12.6 % (ref 11.6–15.4)
WBC: 7.5 10*3/uL (ref 3.4–10.8)

## 2020-11-02 LAB — TSH: TSH: 3.64 u[IU]/mL (ref 0.450–4.500)

## 2020-11-02 LAB — LIPID PANEL
Chol/HDL Ratio: 2.7 ratio (ref 0.0–5.0)
Cholesterol, Total: 148 mg/dL (ref 100–199)
HDL: 54 mg/dL (ref >39)
LDL Chol Calc (NIH): 78 mg/dL (ref 0–99)
Triglycerides: 85 mg/dL (ref 0–149)
VLDL Cholesterol Cal: 16 mg/dL (ref 5–40)

## 2020-11-02 LAB — PSA: Prostate Specific Ag, Serum: 2.3 ng/mL (ref 0.0–4.0)

## 2020-11-05 ENCOUNTER — Encounter
Admission: RE | Disposition: A | Discharge status: Home / Self Care | Payer: Self-pay | Source: Home / Self Care | Attending: Gastroenterology

## 2020-11-05 ENCOUNTER — Ambulatory Visit: Payer: 59 | Admitting: Anesthesiology

## 2020-11-05 ENCOUNTER — Encounter: Payer: Self-pay | Admitting: Gastroenterology

## 2020-11-05 ENCOUNTER — Ambulatory Visit
Admission: RE | Admit: 2020-11-05 | Discharge: 2020-11-05 | Disposition: A | Discharge status: Home / Self Care | Payer: 59 | Attending: Gastroenterology | Admitting: Gastroenterology

## 2020-11-05 DIAGNOSIS — K640 First degree hemorrhoids: Secondary | ICD-10-CM | POA: Insufficient documentation

## 2020-11-05 DIAGNOSIS — Z1211 Encounter for screening for malignant neoplasm of colon: Secondary | ICD-10-CM | POA: Diagnosis not present

## 2020-11-05 DIAGNOSIS — Z86010 Personal history of colonic polyps: Secondary | ICD-10-CM | POA: Diagnosis not present

## 2020-11-05 DIAGNOSIS — Z806 Family history of leukemia: Secondary | ICD-10-CM | POA: Insufficient documentation

## 2020-11-05 DIAGNOSIS — Z880 Allergy status to penicillin: Secondary | ICD-10-CM | POA: Insufficient documentation

## 2020-11-05 DIAGNOSIS — K635 Polyp of colon: Secondary | ICD-10-CM

## 2020-11-05 DIAGNOSIS — Z87891 Personal history of nicotine dependence: Secondary | ICD-10-CM | POA: Diagnosis not present

## 2020-11-05 DIAGNOSIS — Z79899 Other long term (current) drug therapy: Secondary | ICD-10-CM | POA: Diagnosis not present

## 2020-11-05 DIAGNOSIS — Z8249 Family history of ischemic heart disease and other diseases of the circulatory system: Secondary | ICD-10-CM | POA: Insufficient documentation

## 2020-11-05 DIAGNOSIS — D122 Benign neoplasm of ascending colon: Secondary | ICD-10-CM | POA: Diagnosis not present

## 2020-11-05 DIAGNOSIS — Z8719 Personal history of other diseases of the digestive system: Secondary | ICD-10-CM | POA: Diagnosis not present

## 2020-11-05 DIAGNOSIS — Z7982 Long term (current) use of aspirin: Secondary | ICD-10-CM | POA: Insufficient documentation

## 2020-11-05 DIAGNOSIS — I10 Essential (primary) hypertension: Secondary | ICD-10-CM | POA: Diagnosis not present

## 2020-11-05 DIAGNOSIS — I252 Old myocardial infarction: Secondary | ICD-10-CM | POA: Diagnosis not present

## 2020-11-05 DIAGNOSIS — Z86018 Personal history of other benign neoplasm: Secondary | ICD-10-CM | POA: Diagnosis not present

## 2020-11-05 HISTORY — DX: Essential (primary) hypertension: I10

## 2020-11-05 HISTORY — DX: Acute myocardial infarction, unspecified: I21.9

## 2020-11-05 HISTORY — PX: COLONOSCOPY WITH PROPOFOL: SHX5780

## 2020-11-05 SURGERY — COLONOSCOPY WITH PROPOFOL
Anesthesia: General

## 2020-11-05 MED ORDER — MIDAZOLAM HCL 2 MG/2ML IJ SOLN
INTRAMUSCULAR | Status: DC | PRN
  Administered 2020-11-05: 2 mg via INTRAVENOUS

## 2020-11-05 MED ORDER — PROPOFOL 500 MG/50ML IV EMUL
INTRAVENOUS | Status: AC
  Filled 2020-11-05: qty 50

## 2020-11-05 MED ORDER — LIDOCAINE HCL (CARDIAC) PF 100 MG/5ML IV SOSY
PREFILLED_SYRINGE | INTRAVENOUS | Status: DC | PRN
  Administered 2020-11-05: 50 mg via INTRAVENOUS

## 2020-11-05 MED ORDER — FENTANYL CITRATE (PF) 100 MCG/2ML IJ SOLN
INTRAMUSCULAR | Status: DC | PRN
  Administered 2020-11-05 (×4): 25 ug via INTRAVENOUS

## 2020-11-05 MED ORDER — PROPOFOL 500 MG/50ML IV EMUL
INTRAVENOUS | Status: DC | PRN
  Administered 2020-11-05: 50 ug/kg/min via INTRAVENOUS

## 2020-11-05 MED ORDER — LIDOCAINE HCL (PF) 2 % IJ SOLN
INTRAMUSCULAR | Status: AC
  Filled 2020-11-05: qty 20

## 2020-11-05 MED ORDER — FENTANYL CITRATE (PF) 100 MCG/2ML IJ SOLN
INTRAMUSCULAR | Status: AC
  Filled 2020-11-05: qty 2

## 2020-11-05 MED ORDER — SODIUM CHLORIDE 0.9 % IV SOLN: INTRAVENOUS | Status: DC

## 2020-11-05 MED ORDER — MIDAZOLAM HCL 2 MG/2ML IJ SOLN
INTRAMUSCULAR | Status: AC
  Filled 2020-11-05: qty 2

## 2020-11-05 MED ORDER — PROPOFOL 10 MG/ML IV BOLUS
INTRAVENOUS | Status: DC | PRN
  Administered 2020-11-05: 30 mg via INTRAVENOUS
  Administered 2020-11-05: 20 mg via INTRAVENOUS

## 2020-11-05 NOTE — Anesthesia Postprocedure Evaluation (Signed)
Anesthesia Post Note  Patient: Daniel Benson  Procedure(s) Performed: COLONOSCOPY WITH PROPOFOL  Patient location during evaluation: PACU Anesthesia Type: General Level of consciousness: awake and alert Pain management: pain level controlled Vital Signs Assessment: post-procedure vital signs reviewed and stable Respiratory status: spontaneous breathing, nonlabored ventilation, respiratory function stable and patient connected to nasal cannula oxygen Cardiovascular status: stable and blood pressure returned to baseline Postop Assessment: no apparent nausea or vomiting Anesthetic complications: no   No notable events documented.   Last Vitals:  Vitals:   11/05/20 0748 11/05/20 0900  BP: 129/87 (!) 150/134  Pulse: (!) 59 (!) 53  Resp: 18 12  Temp: (!) 35.6 C   SpO2: 98% 98%    Last Pain:  Vitals:   11/05/20 0748  TempSrc: Temporal  PainSc: 0-No pain                 Madelena Maturin Doyne Keel

## 2020-11-05 NOTE — H&P (Signed)
Jonathon Bellows, MD [address], [place], [place], [place], [address] [address], [place], [place], [place], [address] Phone: [phone]  Fax: [phone]  Primary Care Physician:  [name], MD   Pre-Procedure History & Physical: HPI:  Daniel Benson is a 60 y.o. male is here for an colonoscopy.   Past Medical History:  Diagnosis Date   Chest pain    ED (erectile dysfunction)    Heartburn    OCCASIONAL   History of dysplastic nevus 08/24/2012   right lateral mid back/moderate, right ant lat mid thigh/mild   Hx of dysplastic nevus 07/22/2006   left UQA periumbilical/moderate   Hypertension    Myocardial infarction ([place])    Pectus excavatum    Precancerous lesion    H/O    Past Surgical History:  Procedure Laterality Date   COLONOSCOPY WITH PROPOFOL N/A 10/15/2017   Procedure: COLONOSCOPY WITH PROPOFOL;  Surgeon: Jonathon Bellows, MD;  Location: [facility] ENDOSCOPY;  Service: Gastroenterology;  Laterality: N/A;   CORONARY ANGIOPLASTY     HERNIA REPAIR     X 2   INGUINAL HERNIA REPAIR Left    LEFT HEART CATH AND CORONARY ANGIOGRAPHY Left 07/15/2018   Procedure: LEFT HEART CATH AND CORONARY ANGIOGRAPHY;  Surgeon: Minna Merritts, MD;  Location: [facility];  Service: Cardiovascular;  Laterality: Left;    Prior to Admission medications   Medication Sig Start Date End Date Taking? Authorizing Provider  amiodarone (PACERONE) 100 MG tablet TAKE 1 TABLET BY MOUTH EVERY DAY 09/18/20  Yes Deboraha Sprang, MD  carvedilol (COREG) 3.125 MG tablet TAKE 1 TABLET (3.125 MG TOTAL) BY MOUTH 2 (TWO) TIMES DAILY. 07/17/20 11/05/20 Yes Deboraha Sprang, MD  ENTRESTO 24-26 MG TAKE 1 TABLET BY MOUTH TWICE A DAY 05/14/20  Yes Minna Merritts, MD  spironolactone (ALDACTONE) 25 MG tablet TAKE 1 TABLET BY MOUTH EVERY DAY 09/18/20  Yes Deboraha Sprang, MD  acetaminophen (TYLENOL) 500 MG tablet Take 500-1,000 mg by mouth every 6 (six) hours as needed (for pain.).    [provider]  aspirin EC 81 MG tablet Take 81 mg by mouth every evening.    [provider]  atorvastatin (LIPITOR) 10 MG tablet TAKE 1 TABLET BY MOUTH EVERY DAY 10/31/20   [name], MD  Biotin 1000 MCG tablet Take 1,000 mcg by mouth every evening.    [provider]  ibuprofen (ADVIL) 200 MG tablet Take 400 mg by mouth every 8 (eight) hours as needed (for pain.).    [provider]  meclizine (ANTIVERT) 25 MG tablet Take 1 tablet (25 mg total) by mouth 3 (three) times daily as needed for dizziness. 11/14/19   [name], MD  naproxen sodium (ALEVE) 220 MG tablet Take 220 mg by mouth 2 (two) times daily as needed (pain.).    [provider]  sildenafil (VIAGRA) 50 MG tablet Take 1 tablet (50 mg total) by mouth daily as needed for erectile dysfunction. 09/27/19   [name], MD  valACYclovir (VALTREX) 1000 MG tablet 2 pills every 12 hours for one day at onset of fever blister/cold sore Patient taking differently: Take 1,000 mg by mouth 2 (two) times daily as needed (cold sores/fever blisters.). 01/31/16   Carmon Ginsberg, PA    Allergies as of 09/20/2020 - Review Complete 09/20/2020  Allergen Reaction Noted   Penicillins Diarrhea 05/26/2011    Family History  Problem Relation Age of  Onset   Hypertension Mother    Diabetes Mother    Dementia Mother    Hypertension Father    Pulmonary fibrosis Father    Leukemia Brother     Social History   Socioeconomic History   Marital status: Married    Spouse name: Not on file   Number of children: Not on file   Years of education: Not on file   Highest education level: Not on file  Occupational History   Occupation: LINEMAN    Employer: [employer]  Tobacco Use   Smoking status: Former    Packs/day: 1.00    Years: 30.00    Pack years: 30.00    Types: Cigarettes    Quit date: 05/26/2006    Years since quitting: 14.4   Smokeless tobacco: Never  Vaping Use   Vaping Use:  Never used  Substance and Sexual Activity   Alcohol use: Yes    Alcohol/week: 2.0 standard drinks    Types: 2 Cans of beer per week    Comment: OCCASIONAL   Drug use: No   Sexual activity: Yes  Other Topics Concern   Not on file  Social History Narrative   Not on file   Social Determinants of Health   Financial Resource Strain: Not on file  Food Insecurity: Not on file  Transportation Needs: Not on file  Physical Activity: Not on file  Stress: Not on file  Social Connections: Not on file  Intimate Partner Violence: Not on file    Review of Systems: See HPI, otherwise negative ROS  Physical Exam: BP 129/87   Pulse (!) 59   Temp (!) 96.1 F (35.6 C) (Temporal)   Resp 18   Ht '5\' 11"'$  (1.803 m)   Wt 83.5 kg   SpO2 98%   BMI 25.66 kg/m  General:   Alert,  pleasant and cooperative in NAD Head:  Normocephalic and atraumatic. Neck:  Supple; no masses or thyromegaly. Lungs:  Clear throughout to auscultation, normal respiratory effort.    Heart:  +S1, +S2, Regular rate and rhythm, No edema. Abdomen:  Soft, nontender and nondistended. Normal bowel sounds, without guarding, and without rebound.   Neurologic:  Alert and  oriented x4;  grossly normal neurologically.  Impression/Plan: Daniel Benson is here for an colonoscopy to be performed for surveillance due to prior history of colon polyps   Risks, benefits, limitations, and alternatives regarding  colonoscopy have been reviewed with the patient.  Questions have been answered.  All parties agreeable.   Jonathon Bellows, MD  11/05/2020, 8:30 AM

## 2020-11-05 NOTE — Anesthesia Preprocedure Evaluation (Addendum)
Anesthesia Evaluation  Patient identified by MRN, date of birth, ID band Patient awake    Reviewed: Allergy & Precautions, NPO status , Patient's Chart, lab work & pertinent test results  History of Anesthesia Complications (+) history of anesthetic complications  Airway Mallampati: II  TM Distance: >3 FB Neck ROM: Full    Dental no notable dental hx.    Pulmonary former smoker,    Pulmonary exam normal        Cardiovascular Exercise Tolerance: Good hypertension, + Past MI  Normal cardiovascular examI     Neuro/Psych    GI/Hepatic   Endo/Other    Renal/GU      Musculoskeletal   Abdominal Normal abdominal exam  (+)   Peds  Hematology   Anesthesia Other Findings EF 30-35%  Reproductive/Obstetrics                            Anesthesia Physical Anesthesia Plan  ASA: 3  Anesthesia Plan: General   Post-op Pain Management:    Induction:   PONV Risk Score and Plan:   Airway Management Planned: Natural Airway and Simple Face Mask  Additional Equipment:   Intra-op Plan:   Post-operative Plan:   Informed Consent: I have reviewed the patients History and Physical, chart, labs and discussed the procedure including the risks, benefits and alternatives for the proposed anesthesia with the patient or authorized representative who has indicated his/her understanding and acceptance.       Plan Discussed with: CRNA  Anesthesia Plan Comments:        Anesthesia Quick Evaluation

## 2020-11-05 NOTE — Transfer of Care (Signed)
Immediate Anesthesia Transfer of Care Note  Patient: Daniel Benson  Procedure(s) Performed: COLONOSCOPY WITH PROPOFOL  Patient Location: PACU  Anesthesia Type:General  Level of Consciousness: sedated  Airway & Oxygen Therapy: Patient Spontanous Breathing and Patient connected to nasal cannula oxygen  Post-op Assessment: Report given to RN and Post -op Vital signs reviewed and stable  Post vital signs: Reviewed and stable  Last Vitals:  Vitals Value Taken Time  BP 97/64 11/05/20 0902  Temp    Pulse 49 11/05/20 0902  Resp 14 11/05/20 0902  SpO2 98 % 11/05/20 0902  Vitals shown include unvalidated device data.  Last Pain:  Vitals:   11/05/20 0748  TempSrc: Temporal  PainSc: 0-No pain         Complications: No notable events documented.

## 2020-11-05 NOTE — Op Note (Signed)
[facility] Gastroenterology Patient Name: Daniel Benson Procedure Date: 11/05/2020 8:28 AM MRN: [id number] Account #: [id number] Date of Birth: [birth year] Admit Type: Outpatient Age: 60 Room: [facility] ENDO ROOM 4 Gender: Male Note Status: Finalized Instrument Name: Jasper Riling [id number] Procedure:             Colonoscopy Indications:           Surveillance: Personal history of adenomatous polyps                         on last colonoscopy 3 years ago Providers:             Jonathon Bellows MD, MD Referring MD:          Janine Ores. Rosanna Randy, MD (Referring MD) Medicines:             Monitored Anesthesia Care Complications:         No immediate complications. Procedure:             Pre-Anesthesia Assessment:                        - Prior to the procedure, a History and Physical was                         performed, and patient medications, allergies and                         sensitivities were reviewed. The patient's tolerance                         of previous anesthesia was reviewed.                        - The risks and benefits of the procedure and the                         sedation options and risks were discussed with the                         patient. All questions were answered and informed                         consent was obtained.                        - ASA Grade Assessment: II - A patient with mild                         systemic disease.                        After obtaining informed consent, the colonoscope was                         passed under direct vision. Throughout the procedure,                         the patient's blood pressure, pulse, and oxygen  saturations were monitored continuously. The                         Colonoscope was introduced through the anus and                         advanced to the the cecum, identified by the                         appendiceal orifice. The colonoscopy was performed                          with ease. The patient tolerated the procedure well.                         The quality of the bowel preparation was adequate. Findings:      Two sessile polyps were found in the ascending colon. The polyps were 5       to 7 mm in size. These polyps were removed with a cold snare. Resection       and retrieval were complete.      Non-bleeding internal hemorrhoids were found during retroflexion and       during endoscopy. The hemorrhoids were medium-sized and Grade I       (internal hemorrhoids that do not prolapse).      The exam was otherwise without abnormality on direct and retroflexion       views. Impression:            - Two 5 to 7 mm polyps in the ascending colon, removed                         with a cold snare. Resected and retrieved.                        - Non-bleeding internal hemorrhoids.                        - The examination was otherwise normal on direct and                         retroflexion views. Recommendation:        - Discharge patient to home (with escort).                        - Resume previous diet.                        - Continue present medications.                        - Await pathology results.                        - Repeat colonoscopy for surveillance based on                         pathology results. Procedure Code(s):     --- Professional ---                        [phone], Colonoscopy,  flexible; with removal of                         tumor(s), polyp(s), or other lesion(s) by snare                         technique Diagnosis Code(s):     --- Professional ---                        Z86.010, Personal history of colonic polyps                        K63.5, Polyp of colon                        K64.0, First degree hemorrhoids CPT copyright 2019 American Medical Association. All rights reserved. The codes documented in this report are preliminary and upon coder review may  be revised to meet current compliance  requirements. Jonathon Bellows, MD Jonathon Bellows MD, MD 11/05/2020 8:59:46 AM This report has been signed electronically. Number of Addenda: 0 Note Initiated On: 11/05/2020 8:28 AM Scope Withdrawal Time: 0 hours 18 minutes 21 seconds  Total Procedure Duration: 0 hours 21 minutes 16 seconds  Estimated Blood Loss:  Estimated blood loss: none.      [facility]

## 2020-11-06 ENCOUNTER — Encounter: Payer: Self-pay | Admitting: Gastroenterology

## 2020-11-06 LAB — SURGICAL PATHOLOGY

## 2020-11-14 ENCOUNTER — Encounter: Payer: Self-pay | Admitting: Gastroenterology

## 2020-11-16 ENCOUNTER — Other Ambulatory Visit: Payer: Self-pay | Admitting: Cardiovascular Disease

## 2020-12-09 ENCOUNTER — Other Ambulatory Visit: Payer: Self-pay | Admitting: Internal Medicine

## 2020-12-20 ENCOUNTER — Other Ambulatory Visit: Payer: Self-pay

## 2020-12-20 ENCOUNTER — Ambulatory Visit (INDEPENDENT_AMBULATORY_CARE_PROVIDER_SITE_OTHER): Payer: 59 | Admitting: General Surgery

## 2020-12-20 ENCOUNTER — Encounter: Payer: Self-pay | Admitting: General Surgery

## 2020-12-20 VITALS — BP 111/70 | HR 53 | Temp 98.6°F | Resp 12 | Ht 71.0 in | Wt 196.0 lb

## 2020-12-20 DIAGNOSIS — K429 Umbilical hernia without obstruction or gangrene: Secondary | ICD-10-CM | POA: Diagnosis not present

## 2020-12-22 NOTE — Progress Notes (Signed)
Daniel Benson; [id number]; Jul 10, 1960   HPI Patient is a 60 year old white male who referred himself to my care for evaluation and treatment of an umbilical hernia.  He has had an umbilical hernia for some time now.  It seems to have increased in size recently.  He denies any nausea or vomiting.  It is made worse with straining. Past Medical History:  Diagnosis Date   Chest pain    ED (erectile dysfunction)    Heartburn    OCCASIONAL   History of dysplastic nevus 08/24/2012   right lateral mid back/moderate, right ant lat mid thigh/mild   Hx of dysplastic nevus 07/22/2006   left UQA periumbilical/moderate   Hypertension    Myocardial infarction ([place])    Pectus excavatum    Precancerous lesion    H/O    Past Surgical History:  Procedure Laterality Date   COLONOSCOPY WITH PROPOFOL N/A 10/15/2017   Procedure: COLONOSCOPY WITH PROPOFOL;  Surgeon: Jonathon Bellows, MD;  Location: [facility] ENDOSCOPY;  Service: Gastroenterology;  Laterality: N/A;   COLONOSCOPY WITH PROPOFOL N/A 11/05/2020   Procedure: COLONOSCOPY WITH PROPOFOL;  Surgeon: Jonathon Bellows, MD;  Location: [facility] ENDOSCOPY;  Service: Gastroenterology;  Laterality: N/A;   CORONARY ANGIOPLASTY     HERNIA REPAIR     X 2   INGUINAL HERNIA REPAIR Left    LEFT HEART CATH AND CORONARY ANGIOGRAPHY Left 07/15/2018   Procedure: LEFT HEART CATH AND CORONARY ANGIOGRAPHY;  Surgeon: Minna Merritts, MD;  Location: [place] CV LAB;  Service: Cardiovascular;  Laterality: Left;    Family History  Problem Relation Age of Onset   Hypertension Mother    Diabetes Mother    Dementia Mother    Hypertension Father    Pulmonary fibrosis Father    Leukemia Brother     Current Outpatient Medications on File Prior to Visit  Medication Sig Dispense Refill   acetaminophen (TYLENOL) 500 MG tablet Take 500-1,000 mg by mouth every 6 (six) hours as needed (for pain.).     amiodarone (PACERONE) 100 MG tablet TAKE 1 TABLET BY MOUTH EVERY DAY 90 tablet 1    aspirin EC 81 MG tablet Take 81 mg by mouth every evening.     atorvastatin (LIPITOR) 10 MG tablet TAKE 1 TABLET BY MOUTH EVERY DAY 90 tablet 3   Biotin 1000 MCG tablet Take 1,000 mcg by mouth every evening.     carvedilol (COREG) 3.125 MG tablet TAKE 1 TABLET (3.125 MG TOTAL) BY MOUTH 2 (TWO) TIMES DAILY. 60 tablet 6   ENTRESTO 24-26 MG TAKE 1 TABLET BY MOUTH TWICE A DAY 60 tablet 5   ibuprofen (ADVIL) 200 MG tablet Take 400 mg by mouth every 8 (eight) hours as needed (for pain.).     meclizine (ANTIVERT) 25 MG tablet Take 1 tablet (25 mg total) by mouth 3 (three) times daily as needed for dizziness. 30 tablet 2   naproxen sodium (ALEVE) 220 MG tablet Take 220 mg by mouth 2 (two) times daily as needed (pain.).     sildenafil (VIAGRA) 50 MG tablet Take 1 tablet (50 mg total) by mouth daily as needed for erectile dysfunction. 10 tablet 5   spironolactone (ALDACTONE) 25 MG tablet TAKE 1 TABLET BY MOUTH EVERY DAY 90 tablet 0   valACYclovir (VALTREX) 1000 MG tablet 2 pills every 12 hours for one day at onset of fever blister/cold sore (Patient taking differently: Take 1,000 mg by mouth 2 (two) times daily as needed (cold sores/fever blisters.).) 12 tablet  1   No current facility-administered medications on file prior to visit.    Allergies  Allergen Reactions   Penicillins Diarrhea    Stomach upset Did it involve swelling of the face/tongue/throat, SOB, or low BP? No Did it involve sudden or severe rash/hives, skin peeling, or any reaction on the inside of your mouth or nose? No Did you need to seek medical attention at a hospital or doctor's office? No When did it last happen? More than 20 years ago If all above answers are "NO", may proceed with cephalosporin use.     Social History   Substance and Sexual Activity  Alcohol Use Yes   Alcohol/week: 2.0 standard drinks   Types: 2 Cans of beer per week   Comment: OCCASIONAL    Social History   Tobacco Use  Smoking Status Former    Packs/day: 1.00   Years: 30.00   Pack years: 30.00   Types: Cigarettes   Quit date: 05/26/2006   Years since quitting: 14.5  Smokeless Tobacco Never    Review of Systems  Constitutional: Negative.   HENT: Negative.    Eyes: Negative.   Respiratory:  Positive for shortness of breath.   Cardiovascular: Negative.   Gastrointestinal: Negative.   Genitourinary: Negative.   Musculoskeletal: Negative.   Skin: Negative.   Neurological: Negative.   Endo/Heme/Allergies: Negative.   Psychiatric/Behavioral: Negative.     Objective   Vitals:   12/20/20 1109  BP: 111/70  Pulse: (!) 53  Resp: 12  Temp: 98.6 F (37 C)  SpO2: 94%    Physical Exam Vitals reviewed.  Constitutional:      Appearance: Normal appearance. He is normal weight. He is not ill-appearing.  HENT:     Head: Normocephalic and atraumatic.  Cardiovascular:     Rate and Rhythm: Normal rate and regular rhythm.     Heart sounds: Normal heart sounds. No murmur heard.   No friction rub. No gallop.  Pulmonary:     Effort: Pulmonary effort is normal. No respiratory distress.     Breath sounds: Normal breath sounds. No stridor. No wheezing, rhonchi or rales.  Abdominal:     General: Bowel sounds are normal. There is no distension.     Palpations: Abdomen is soft. There is no mass.     Tenderness: There is no abdominal tenderness. There is no guarding or rebound.     Hernia: A hernia is present.     Comments: Reducible umbilical hernia noted.  Skin:    General: Skin is warm and dry.  Neurological:     Mental Status: He is alert and oriented to person, place, and time.  Cardiology notes reviewed  Assessment  Umbilical hernia, currently asymptomatic Cardiomyopathy with ejection fraction around 30% Plan  I told the patient that I do not recommend an umbilical herniorrhaphy at this time as he is asymptomatic and he has a significant cardiac condition which places him at high risk for any surgical intervention.  He  understands and agrees.  Follow-up here as needed.

## 2021-01-19 ENCOUNTER — Other Ambulatory Visit: Payer: Self-pay | Admitting: Internal Medicine

## 2021-01-23 NOTE — Progress Notes (Signed)
Patient ID: Daniel Benson, male   DOB: 10/19/1960, 60 y.o.   MRN: [id number]      Patient Care Team: [name], MD as PCP - General (Unknown Physician Specialty)   HPI  Daniel Benson is a 60 y.o. male seen in followup for PVC and NICM with abnormal MRI DEMONSTRATING FULL-THICKNESS SCARRING  PVC fractionated.  Amiodarone initiated for suppression trial  3/21 to good effect.  Ejection fraction has been borderline, the patient has been reluctant to receive an ICD and given the paucity of data in the era of new guideline directed medical therapy we have chosen to wait  Retired in May, issues of insurance will be forthcoming at the end of the calendar year  [place] earlier 2022 >> discontinuing of SGLT 2   The patient denies chest pain, shortness of breath, nocturnal dyspnea, orthopnea or peripheral edema.  There have been no palpitations, lightheadedness or syncope. .       Patient denies symptoms of GI intolerance, sun sensitivity, neurological symptoms attributable to amiodarone.     Retired from [employer]  DATE TEST EF    5/20 LHC  30 % Cors- w/o significant obstruction  10/20 Echo  25-30% RV dysfn-mild; no RVE  3/21 cMRI 33% LGE-transmural inferior wall and RV attachment to LV   5/21 Echo 30%   9/21 Echo  30-35%   1/22 Echo 30-35%   08/22 Echo 30-35%       Date Cr K TSH LFTs Hgb  11/20   1.52( 11/19) 23   2/21 1.36 4.0   16.1   8/21 1.33 4.5 3.17 23 14.3  2/22 1.26 4.8 2.970    9/22 1.21 4.7 3.64 22 15.0    Date PVCs  3/21 27%  5/21   1%      Records and Results Reviewed   Past Medical History:  Diagnosis Date   Chest pain    ED (erectile dysfunction)    Heartburn    OCCASIONAL   History of dysplastic nevus 08/24/2012   right lateral mid back/moderate, right ant lat mid thigh/mild   Hx of dysplastic nevus 07/22/2006   left UQA periumbilical/moderate   Hypertension    Myocardial infarction ([place])    Pectus excavatum    Precancerous  lesion    H/O    Past Surgical History:  Procedure Laterality Date   COLONOSCOPY WITH PROPOFOL N/A 10/15/2017   Procedure: COLONOSCOPY WITH PROPOFOL;  Surgeon: Jonathon Bellows, MD;  Location: [facility] ENDOSCOPY;  Service: Gastroenterology;  Laterality: N/A;   COLONOSCOPY WITH PROPOFOL N/A 11/05/2020   Procedure: COLONOSCOPY WITH PROPOFOL;  Surgeon: Jonathon Bellows, MD;  Location: [facility] ENDOSCOPY;  Service: Gastroenterology;  Laterality: N/A;   CORONARY ANGIOPLASTY     HERNIA REPAIR     X 2   INGUINAL HERNIA REPAIR Left    LEFT HEART CATH AND CORONARY ANGIOGRAPHY Left 07/15/2018   Procedure: LEFT HEART CATH AND CORONARY ANGIOGRAPHY;  Surgeon: Minna Merritts, MD;  Location: [place] CV LAB;  Service: Cardiovascular;  Laterality: Left;    Current Meds  Medication Sig   acetaminophen (TYLENOL) 500 MG tablet Take 500-1,000 mg by mouth every 6 (six) hours as needed (for pain.).   amiodarone (PACERONE) 100 MG tablet TAKE 1 TABLET BY MOUTH EVERY DAY   aspirin EC 81 MG tablet Take 81 mg by mouth every evening.   atorvastatin (LIPITOR) 10 MG tablet TAKE 1 TABLET BY MOUTH EVERY DAY   Biotin 1000 MCG  tablet Take 1,000 mcg by mouth every evening.   carvedilol (COREG) 3.125 MG tablet TAKE 1 TABLET BY MOUTH 2 TIMES DAILY.   ENTRESTO 24-26 MG TAKE 1 TABLET BY MOUTH TWICE A DAY   ibuprofen (ADVIL) 200 MG tablet Take 400 mg by mouth every 8 (eight) hours as needed (for pain.).   naproxen sodium (ALEVE) 220 MG tablet Take 220 mg by mouth 2 (two) times daily as needed (pain.).   sildenafil (VIAGRA) 50 MG tablet Take 1 tablet (50 mg total) by mouth daily as needed for erectile dysfunction.   spironolactone (ALDACTONE) 25 MG tablet TAKE 1 TABLET BY MOUTH EVERY DAY   valACYclovir (VALTREX) 1000 MG tablet 2 pills every 12 hours for one day at onset of fever blister/cold sore (Patient taking differently: Take 1,000 mg by mouth 2 (two) times daily as needed (cold sores/fever blisters.).)    Allergies  Allergen  Reactions   Penicillins Diarrhea    Stomach upset Did it involve swelling of the face/tongue/throat, SOB, or low BP? No Did it involve sudden or severe rash/hives, skin peeling, or any reaction on the inside of your mouth or nose? No Did you need to seek medical attention at a hospital or doctor's office? No When did it last happen? More than 20 years ago If all above answers are "NO", may proceed with cephalosporin use.     Review of Systems negative except from HPI and PMH  Physical Exam: BP 102/72 (BP Location: Left Arm, Patient Position: Sitting, Cuff Size: Normal)   Pulse 65   Ht 5\' 11"  (1.803 m)   Wt 193 lb (87.5 kg)   SpO2 95%   BMI 26.92 kg/m  Well developed and nourished in no acute distress HENT normal Neck supple with JVP-  flat   Clear Regular rate and rhythm, no murmurs or gallops Abd-soft with active BS No Clubbing cyanosis edema Skin-warm and dry A & Oriented  Grossly normal sensory and motor function  ECG sinus @ 65 Bigeminal PVC LBBB Inf axis     CrCl cannot be calculated (Patient's most recent lab result is older than the maximum 21 days allowed.).   Assessment and  Plan  Cardiomyopathy-nonischemic   cMRI abnormal with full-thickness inferior scar question cause   PVCs left bundle indeterminate axis-fractionated  Intrinsicoid deflection about 70 msc   Congestive heart failure-chronic-systolic class II   Pectus excavatum-moderate-/severe   Right axis deviation/low voltage  Sinus bradycardia  Orthostatic LH/presyncope  Chest pain-atypical   High Risk Medication Surveillance- Amiodarone  Recurrent burden of PVCs.  We will increase his amiodarone to 200 mg twice daily for 2 weeks and 20 mg a day.  He will be following up with Gaspar Cola going forward and I will reach out to Dr. Willis Modena to try to facilitate transfer of care I did mention to the patient that there may be some enthusiasm for catheter ablation given his young age and not  withstanding the prolonged intrinsicoid deflection suggesting epicardial or mid myocardial focus.  We will check a Zio patch in 4 weeks for PVC burden  Minimal dyspnea.  We will continue him on his cardiomyopathy medications including Entresto 24/26, Aldactone 25 and carvedilol 3.125 twice daily.  Did not tolerate the SGLT2

## 2021-01-24 ENCOUNTER — Encounter: Payer: Self-pay | Admitting: Internal Medicine

## 2021-01-24 ENCOUNTER — Other Ambulatory Visit: Payer: Self-pay

## 2021-01-24 ENCOUNTER — Ambulatory Visit (INDEPENDENT_AMBULATORY_CARE_PROVIDER_SITE_OTHER): Payer: 59 | Admitting: Internal Medicine

## 2021-01-24 VITALS — BP 102/72 | HR 65 | Ht 71.0 in | Wt 193.0 lb

## 2021-01-24 DIAGNOSIS — I493 Ventricular premature depolarization: Secondary | ICD-10-CM | POA: Diagnosis not present

## 2021-01-24 DIAGNOSIS — I428 Other cardiomyopathies: Secondary | ICD-10-CM

## 2021-01-24 DIAGNOSIS — I5022 Chronic systolic (congestive) heart failure: Secondary | ICD-10-CM

## 2021-01-24 DIAGNOSIS — Z79899 Other long term (current) drug therapy: Secondary | ICD-10-CM

## 2021-01-24 MED ORDER — AMIODARONE HCL 200 MG PO TABS: ORAL_TABLET | ORAL | 1 refills | Status: DC

## 2021-01-24 MED ORDER — ENTRESTO 24-26 MG PO TABS
1.0000 | ORAL_TABLET | Freq: Two times a day (BID) | ORAL | 3 refills | Status: DC
Start: 2021-01-24 — End: 2021-04-29

## 2021-01-24 NOTE — Patient Instructions (Signed)
Medication Instructions:  - Your physician has recommended you make the following change in your medication:   1) INCREASE amiodarone to 200 mg: - take 1 tablet (200 mg) by mouth TWICE daily x 2 weeks, then - take 1 tablet (200 mg) by mouth ONCE daily   *If you need a refill on your cardiac medications before your next appointment, please call your pharmacy*   Lab Work: - none ordered  If you have labs (blood work) drawn today and your tests are completely normal, you will receive your results only by: [place] (if you have MyChart) OR A paper copy in the mail If you have any lab test that is abnormal or we need to change your treatment, we will call you to review the results.   Testing/Procedures:  1) Heart Monitor:  To Be Placed: the week after Christmas (this will be mailed to your home address Length of Wear: 3 days   - Your physician has recommended that you wear a Zio XT (heart) monitor.   This monitor is a medical device that records the heart's electrical activity. Doctors most often use these monitors to diagnose arrhythmias. Arrhythmias are problems with the speed or rhythm of the heartbeat. The monitor is a small device applied to your chest. You can wear one while you do your normal daily activities. While wearing this monitor if you have any symptoms to push the button and record what you felt. Once you have worn this monitor for the period of time provider prescribed (Usually 14 days), you will return the monitor device in the postage paid box. Once it is returned they will download the data collected and provide [place] with a report which the provider will then review and we will call you with those results. Important tips:  Avoid showering during the first 24 hours of wearing the monitor. Avoid excessive sweating to help maximize wear time. Do not submerge the device, no hot tubs, and no swimming pools. Keep any lotions or oils away from the patch. After 24 hours  you may shower with the patch on. Take brief showers with your back facing the shower head.  Do not remove patch once it has been placed because that will interrupt data and decrease adhesive wear time. Push the button when you have any symptoms and write down what you were feeling. Once you have completed wearing your monitor, remove and place into box which has postage paid and place in your outgoing mailbox.  If for some reason you have misplaced your box then call our office and we can provide another box and/or mail it off for you.      Follow-Up: At [facility], you and your health needs are our priority.  As part of our continuing mission to provide you with exceptional heart care, we have created designated Provider Care Teams.  These Care Teams include your primary Cardiologist (physician) and Advanced Practice Providers (APPs -  Physician Assistants and Nurse Practitioners) who all work together to provide you with the care you need, when you need it.  We recommend signing up for the patient portal called "MyChart".  Sign up information is provided on this After Visit Summary.  MyChart is used to connect with patients for Virtual Visits (Telemedicine).  Patients are able to view lab/test results, encounter notes, upcoming appointments, etc.  Non-urgent messages can be sent to your provider as well.   To learn more about what you can do with MyChart, go to  [email or web address].    Your next appointment:   As needed   The format for your next appointment:   In Person  Provider:   Virl Axe, MD    Other Instructions N/a

## 2021-01-29 ENCOUNTER — Ambulatory Visit (INDEPENDENT_AMBULATORY_CARE_PROVIDER_SITE_OTHER): Payer: 59

## 2021-01-29 DIAGNOSIS — I493 Ventricular premature depolarization: Secondary | ICD-10-CM

## 2021-02-15 ENCOUNTER — Other Ambulatory Visit: Payer: Self-pay | Admitting: Internal Medicine

## 2021-02-19 DIAGNOSIS — I493 Ventricular premature depolarization: Secondary | ICD-10-CM | POA: Diagnosis not present

## 2021-03-03 ENCOUNTER — Other Ambulatory Visit: Payer: Self-pay | Admitting: Internal Medicine

## 2021-04-14 ENCOUNTER — Other Ambulatory Visit: Payer: Self-pay | Admitting: Internal Medicine

## 2021-04-15 NOTE — Telephone Encounter (Signed)
This is a [place] pt 

## 2021-04-16 ENCOUNTER — Telehealth: Payer: Self-pay | Admitting: Internal Medicine

## 2021-04-16 NOTE — Telephone Encounter (Signed)
Patient states Caryl Comes is aware of transfer to [facility] asking if he needs to make appt with [facility] or does he, please assist

## 2021-04-16 NOTE — Telephone Encounter (Signed)
I spoke with the patient. His insurance is currently out of network for practice and Dr. Caryl Comes is aware of that . Per the patient, Dr. Caryl Comes had recommended him possibly seeing Dr. Willis Modena at [facility]. The patient was not sure if he needed to call to make that appointment or if we would do that.  I advised the patient that since he is transferring care due to being out of network, then he would need to call Dr. Candiss Norse office to schedule. I have asked that when he call's Dr. Candiss Norse office, to just let them know we were seeing him, but he is now out of network for [place] and Dr. Caryl Comes recommended Dr. Willis Modena to him.  He is also aware that [facility] should be able to see most of our records on him, but if there is anything we need to forward to them we would be glad to do so.  The patient voices understanding and is agreeable. He was very appreciative of the call back.

## 2021-04-22 ENCOUNTER — Encounter: Payer: Self-pay | Admitting: Internal Medicine

## 2021-04-24 ENCOUNTER — Telehealth: Payer: Self-pay | Admitting: Internal Medicine

## 2021-04-24 NOTE — Telephone Encounter (Signed)
Patient states he needs a referral for a [facility] cardiologist, Dr. Willis Modena. He has changed insurances and needs a referral. Please call to discuss.

## 2021-04-25 ENCOUNTER — Ambulatory Visit: Payer: 59 | Admitting: Family Medicine

## 2021-04-27 ENCOUNTER — Other Ambulatory Visit: Payer: Self-pay | Admitting: Cardiovascular Disease

## 2021-05-01 ENCOUNTER — Other Ambulatory Visit: Payer: Self-pay | Admitting: *Deleted

## 2021-05-01 DIAGNOSIS — I5022 Chronic systolic (congestive) heart failure: Secondary | ICD-10-CM

## 2021-05-01 DIAGNOSIS — I493 Ventricular premature depolarization: Secondary | ICD-10-CM

## 2021-05-01 DIAGNOSIS — I428 Other cardiomyopathies: Secondary | ICD-10-CM

## 2021-05-01 NOTE — Telephone Encounter (Signed)
Referral faxed to Dr. Willis Modena at [facility] per the patient's request.  ?Records faxed to [phone]. ?Confirmation received. ? ?I have called and notified the patient of the above.  ? ?He was appreciative of the call back.  ? ? ?

## 2021-05-30 NOTE — Telephone Encounter (Signed)
Patient scheduled 06-25-21

## 2021-06-05 ENCOUNTER — Encounter: Payer: 59 | Admitting: Dermatology

## 2021-06-29 ENCOUNTER — Other Ambulatory Visit: Payer: Self-pay | Admitting: Internal Medicine

## 2021-07-08 ENCOUNTER — Other Ambulatory Visit: Payer: Self-pay | Admitting: Internal Medicine

## 2021-07-10 NOTE — Telephone Encounter (Signed)
He's a very nice man that fell out of net work with Larence Penning, so we referred him to Dr. Willis Modena at [facility]. ? ?His refill should come from him- It looks like he saw Dr. Willis Modena on 06/25/21. ? ?Thank you! ? ?

## 2021-10-26 ENCOUNTER — Other Ambulatory Visit: Payer: Self-pay | Admitting: Family Medicine

## 2021-10-26 DIAGNOSIS — E782 Mixed hyperlipidemia: Secondary | ICD-10-CM

## 2021-12-30 ENCOUNTER — Other Ambulatory Visit: Payer: Self-pay | Admitting: Otolaryngology

## 2021-12-30 DIAGNOSIS — H9120 Sudden idiopathic hearing loss, unspecified ear: Secondary | ICD-10-CM

## 2022-05-07 ENCOUNTER — Telehealth: Payer: Self-pay | Admitting: Family Medicine

## 2022-05-07 NOTE — Telephone Encounter (Signed)
Patient came into the office on 05/07/2022 to complete & sign a Medical Release to transfer care to [facility]. Records were faxed electronically.
